# Patient Record
Sex: Female | Born: 1985 | Race: White | Hispanic: No | Marital: Married | Smoking: Never smoker
Health system: Southern US, Community
[De-identification: ages and names within clinical notes are randomized; demographics above are authoritative.]

## PROBLEM LIST (undated history)

## (undated) DIAGNOSIS — N809 Endometriosis, unspecified: Secondary | ICD-10-CM

## (undated) DIAGNOSIS — F419 Anxiety disorder, unspecified: Secondary | ICD-10-CM

## (undated) DIAGNOSIS — Z87442 Personal history of urinary calculi: Secondary | ICD-10-CM

## (undated) DIAGNOSIS — F32A Depression, unspecified: Secondary | ICD-10-CM

## (undated) DIAGNOSIS — R112 Nausea with vomiting, unspecified: Secondary | ICD-10-CM

## (undated) DIAGNOSIS — T8859XA Other complications of anesthesia, initial encounter: Secondary | ICD-10-CM

## (undated) DIAGNOSIS — F329 Major depressive disorder, single episode, unspecified: Secondary | ICD-10-CM

## (undated) DIAGNOSIS — M1990 Unspecified osteoarthritis, unspecified site: Secondary | ICD-10-CM

## (undated) DIAGNOSIS — Z9889 Other specified postprocedural states: Secondary | ICD-10-CM

## (undated) HISTORY — DX: Anxiety disorder, unspecified: F41.9

## (undated) HISTORY — DX: Endometriosis, unspecified: N80.9

## (undated) HISTORY — DX: Depression, unspecified: F32.A

## (undated) HISTORY — DX: Major depressive disorder, single episode, unspecified: F32.9

## (undated) HISTORY — PX: GASTRIC BYPASS: SHX52

---
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
## 2007-06-07 ENCOUNTER — Emergency Department: Payer: Self-pay | Admitting: Emergency Medicine

## 2007-09-16 ENCOUNTER — Emergency Department: Payer: Self-pay | Admitting: Unknown Physician Specialty

## 2007-10-12 ENCOUNTER — Ambulatory Visit: Payer: Self-pay | Admitting: Obstetrics and Gynecology

## 2007-10-27 ENCOUNTER — Emergency Department: Payer: Self-pay | Admitting: Emergency Medicine

## 2007-12-24 ENCOUNTER — Observation Stay: Payer: Self-pay | Admitting: Obstetrics and Gynecology

## 2008-01-27 ENCOUNTER — Observation Stay: Payer: Self-pay | Admitting: Obstetrics and Gynecology

## 2008-02-21 ENCOUNTER — Ambulatory Visit: Payer: Self-pay | Admitting: Obstetrics and Gynecology

## 2008-02-24 ENCOUNTER — Observation Stay: Payer: Self-pay | Admitting: Obstetrics and Gynecology

## 2008-03-11 ENCOUNTER — Inpatient Hospital Stay: Payer: Self-pay | Admitting: Obstetrics and Gynecology

## 2008-05-22 ENCOUNTER — Emergency Department: Payer: Self-pay | Admitting: Emergency Medicine

## 2008-06-15 ENCOUNTER — Emergency Department: Payer: Self-pay | Admitting: Emergency Medicine

## 2008-10-09 ENCOUNTER — Emergency Department: Payer: Self-pay | Admitting: Emergency Medicine

## 2008-10-13 ENCOUNTER — Ambulatory Visit: Payer: Self-pay | Admitting: Emergency Medicine

## 2009-03-02 ENCOUNTER — Encounter: Payer: Self-pay | Admitting: Obstetrics and Gynecology

## 2009-03-10 ENCOUNTER — Encounter: Payer: Self-pay | Admitting: Pediatric Cardiology

## 2009-03-23 ENCOUNTER — Encounter: Payer: Self-pay | Admitting: Obstetrics and Gynecology

## 2009-03-31 ENCOUNTER — Encounter: Payer: Self-pay | Admitting: Family Medicine

## 2009-04-05 ENCOUNTER — Encounter: Payer: Self-pay | Admitting: Family Medicine

## 2009-04-06 ENCOUNTER — Encounter: Payer: Self-pay | Admitting: Pediatric Cardiology

## 2009-04-20 ENCOUNTER — Observation Stay: Payer: Self-pay | Admitting: Obstetrics and Gynecology

## 2009-04-21 ENCOUNTER — Encounter: Payer: Self-pay | Admitting: Pediatric Cardiology

## 2009-05-14 ENCOUNTER — Encounter: Payer: Self-pay | Admitting: Pediatric Cardiology

## 2009-06-06 ENCOUNTER — Observation Stay: Payer: Self-pay | Admitting: Obstetrics and Gynecology

## 2009-06-13 ENCOUNTER — Observation Stay: Payer: Self-pay

## 2009-06-19 ENCOUNTER — Observation Stay: Payer: Self-pay | Admitting: Obstetrics & Gynecology

## 2009-06-22 ENCOUNTER — Observation Stay: Payer: Self-pay

## 2009-06-23 ENCOUNTER — Observation Stay: Payer: Self-pay

## 2009-06-25 ENCOUNTER — Inpatient Hospital Stay: Payer: Self-pay

## 2009-06-25 HISTORY — PX: TUBAL LIGATION: SHX77

## 2010-04-11 ENCOUNTER — Emergency Department: Payer: Self-pay | Admitting: Emergency Medicine

## 2010-09-05 HISTORY — PX: FRACTURE SURGERY: SHX138

## 2010-11-02 ENCOUNTER — Emergency Department: Payer: Self-pay | Admitting: Emergency Medicine

## 2011-03-29 ENCOUNTER — Emergency Department: Payer: Self-pay | Admitting: Unknown Physician Specialty

## 2011-04-12 ENCOUNTER — Emergency Department: Payer: Self-pay | Admitting: Emergency Medicine

## 2011-04-28 ENCOUNTER — Ambulatory Visit: Payer: Self-pay | Admitting: Specialist

## 2011-10-11 ENCOUNTER — Emergency Department: Payer: Self-pay | Admitting: Emergency Medicine

## 2011-10-11 LAB — BASIC METABOLIC PANEL
Anion Gap: 12 (ref 7–16)
Co2: 28 mmol/L (ref 21–32)
Creatinine: 0.83 mg/dL (ref 0.60–1.30)
EGFR (African American): >60
EGFR (Non-African Amer.): >60
Glucose: 97 mg/dL (ref 65–99)
Sodium: 144 mmol/L (ref 136–145)

## 2011-10-11 LAB — CBC
MCH: 29.5 pg (ref 26.0–34.0)
MCV: 89 fL (ref 80–100)
Platelet: 277 10*3/uL (ref 150–440)
RDW: 13.7 % (ref 11.5–14.5)
WBC: 9 10*3/uL (ref 3.6–11.0)

## 2011-10-11 LAB — URINALYSIS, COMPLETE
Bilirubin,UR: NEGATIVE
Ketone: NEGATIVE
Ph: 5 (ref 4.5–8.0)
RBC,UR: 1 /HPF (ref 0–5)
Specific Gravity: 1.027 (ref 1.003–1.030)
Squamous Epithelial: 3
WBC UR: 1 /HPF (ref 0–5)

## 2012-10-12 ENCOUNTER — Ambulatory Visit (INDEPENDENT_AMBULATORY_CARE_PROVIDER_SITE_OTHER): Payer: 59 | Admitting: Family Medicine

## 2012-10-12 ENCOUNTER — Encounter: Payer: Self-pay | Admitting: Family Medicine

## 2012-10-12 VITALS — BP 108/74 | HR 68 | Temp 98.4°F | Ht 70.0 in | Wt 291.0 lb

## 2012-10-12 DIAGNOSIS — F411 Generalized anxiety disorder: Secondary | ICD-10-CM

## 2012-10-12 DIAGNOSIS — E669 Obesity, unspecified: Secondary | ICD-10-CM

## 2012-10-12 DIAGNOSIS — R5381 Other malaise: Secondary | ICD-10-CM

## 2012-10-12 DIAGNOSIS — R5383 Other fatigue: Secondary | ICD-10-CM

## 2012-10-12 DIAGNOSIS — Z8349 Family history of other endocrine, nutritional and metabolic diseases: Secondary | ICD-10-CM

## 2012-10-12 DIAGNOSIS — F419 Anxiety disorder, unspecified: Secondary | ICD-10-CM | POA: Insufficient documentation

## 2012-10-12 LAB — COMPREHENSIVE METABOLIC PANEL
Albumin: 4.1 g/dL (ref 3.5–5.2)
Alkaline Phosphatase: 54 U/L (ref 39–117)
BUN: 13 mg/dL (ref 6–23)
CO2: 28 mEq/L (ref 19–32)
Glucose, Bld: 92 mg/dL (ref 70–99)
Potassium: 4.4 mEq/L (ref 3.5–5.3)

## 2012-10-12 LAB — CBC WITH DIFFERENTIAL/PLATELET
Eosinophils Absolute: 0.1 10*3/uL (ref 0.0–0.7)
Hemoglobin: 12.9 g/dL (ref 12.0–15.0)
Lymphocytes Relative: 19 % (ref 12–46)
Lymphs Abs: 1.3 10*3/uL (ref 0.7–4.0)
MCH: 28.4 pg (ref 26.0–34.0)
Monocytes Relative: 7 % (ref 3–12)
Neutro Abs: 5 10*3/uL (ref 1.7–7.7)
Neutrophils Relative %: 72 % (ref 43–77)
RBC: 4.54 MIL/uL (ref 3.87–5.11)
WBC: 6.8 10*3/uL (ref 4.0–10.5)

## 2012-10-12 LAB — T4, FREE: Free T4: 1.04 ng/dL (ref 0.80–1.80)

## 2012-10-12 MED ORDER — SERTRALINE HCL 50 MG PO TABS: 50.0000 mg | ORAL_TABLET | Freq: Every day | ORAL | Status: DC

## 2012-10-12 NOTE — Progress Notes (Signed)
Subjective:    Patient ID: Lauren Macias, female    DOB: [birth year], 27 y.o.   MRN: [id number]  HPI  Very pleasant 27 yo G4P3 here to establish care.  Anxiety- has had depression and anxiety since she was 27 years old, on a off.  As a teenager, she was placed on Lithium and then had subsequent suicide attempt. Both she and her physician at the time though suicide attempt was triggered by adverse rxn to Lithium.   Since her teenage years, she has only needed to be on an antidepressant once- post partum with one of her sons.  She was on Zoloft and she felt it worked well.  Two months ago, went to her physician for worsening anxiety- she is constantly anxious that something will happen to one of her children.  He started her on Celexa 20 mg.  She states she actually feels worse. She has been trying to lose weight and feels this is hindering that.  She also has decreased sex drive.  Denies any symptoms of depression. No SI or HI.  Obesity- previously weighed 338 lbs.  She is walking 30 minutes per day on a treadmill and motivated to lose weight. Wt Readings from Last 3 Encounters:  10/12/12 291 lb (131.997 kg)   Has a strong FH of hypothyroidism and wants to be tested today. Denies any other symptoms of hypothyroidism currently other than difficulty losing weight and fatigue.  Patient Active Problem List  Diagnosis  . Anxiety  . Obesity   Past Medical History  Diagnosis Date  . Anxiety   . Endometriosis   . Depression     h/o suicide attempt as teenager, she feels it was caused by lithium   No past surgical history on file. History  Substance Use Topics  . Smoking status: Never Smoker   . Smokeless tobacco: Not on file  . Alcohol Use: Not on file   Family History  Problem Relation Age of Onset  . Hypothyroidism Mother   . Hypothyroidism Maternal Aunt   . Cancer Paternal Aunt 58    breast CA   Allergies  Allergen Reactions  . Penicillins Anaphylaxis   Current  Outpatient Prescriptions on File Prior to Visit  Medication Sig Dispense Refill  . sertraline (ZOLOFT) 50 MG tablet Take 1 tablet (50 mg total) by mouth daily.  30 tablet  3   The PMH, PSH, Social History, Family History, Medications, and allergies have been reviewed in [facility], and have been updated if relevant.    Review of Systems See HPI     Objective:   Physical Exam BP 108/74  Pulse 68  Temp 98.4 F (36.9 C)  Ht 5\' 10"  (1.778 m)  Wt 291 lb (131.997 kg)  BMI 41.75 kg/m2  General:  obese,well-nourished,in no acute distress; alert,appropriate and cooperative throughout examination Head:  normocephalic and atraumatic.   Eyes:  vision grossly intact, pupils equal, pupils round, and pupils reactive to light.   Ears:  R ear normal and L ear normal.   Nose:  no external deformity.   Mouth:  good dentition.   Neck:  +enlarged thyroid, non tender, no masses noted. Lungs:  Normal respiratory effort, chest expands symmetrically. Lungs are clear to auscultation, no crackles or wheezes. Heart:  Normal rate and regular rhythm. S1 and S2 normal without gallop, murmur, click, rub or other extra sounds. Abdomen:  Bowel sounds positive,abdomen soft and non-tender without masses, organomegaly or hernias noted. Msk:  No deformity or scoliosis noted  of thoracic or lumbar spine.   Extremities:  No clubbing, cyanosis, edema, or deformity noted with normal full range of motion of all joints.   Neurologic:  alert & oriented X3 and gait normal.   Skin:  Intact without suspicious lesions or rashes Psych:  Cognition and judgment appear intact. Alert and cooperative with normal attention span and concentration. No apparent delusions, illusions, hallucinations        Assessment & Plan:   1. Fatigue  Likely due to very busy lifestyle but will check labs to rule out other possible contributing factors. Comprehensive metabolic panel, CBC with Differential, T4, Free  2. Family history of thyroid disease   See above. TSH, T4, Free  3. Obesity  Congratulated her on her continued weight loss and positive life style changes.   4. Anxiety  Deteriorated.  Will wean off celexa and start Zoloft 50 mg daily as this has worked well for her in past.  Follow up in 3 weeks. The patient indicates understanding of these issues and agrees with the plan.

## 2012-10-12 NOTE — Patient Instructions (Addendum)
Great to see you. Let's wean off of the citalopram- 1 tablet every other day for 1 week and stop. Ok to go ahead and start the Zoloft. Let me know how you're doing in 3 weeks.  Please schedule a complete physical/pap smear in October.  Have a good weekend with [place] and the boys.  :)

## 2012-10-24 ENCOUNTER — Ambulatory Visit: Payer: 59 | Admitting: Family Medicine

## 2012-10-25 ENCOUNTER — Emergency Department: Payer: Self-pay | Admitting: Emergency Medicine

## 2012-10-25 LAB — CBC
MCH: 29.1 pg (ref 26.0–34.0)
MCHC: 33 g/dL (ref 32.0–36.0)
MCV: 88 fL (ref 80–100)
RBC: 4.6 10*6/uL (ref 3.80–5.20)
RDW: 14.2 % (ref 11.5–14.5)
WBC: 6.7 10*3/uL (ref 3.6–11.0)

## 2012-10-25 LAB — COMPREHENSIVE METABOLIC PANEL
Albumin: 3.7 g/dL (ref 3.4–5.0)
Alkaline Phosphatase: 73 U/L (ref 50–136)
BUN: 14 mg/dL (ref 7–18)
Bilirubin,Total: 0.5 mg/dL (ref 0.2–1.0)
Calcium, Total: 8.7 mg/dL (ref 8.5–10.1)
EGFR (Non-African Amer.): >60
Glucose: 82 mg/dL (ref 65–99)
Osmolality: 277 (ref 275–301)
Potassium: 4.5 mmol/L (ref 3.5–5.1)
Sodium: 139 mmol/L (ref 136–145)

## 2012-10-25 LAB — URINALYSIS, COMPLETE
Bilirubin,UR: NEGATIVE
Nitrite: NEGATIVE
Protein: NEGATIVE
RBC,UR: 2 /HPF (ref 0–5)
Specific Gravity: 1.021 (ref 1.003–1.030)
WBC UR: 7 /HPF (ref 0–5)

## 2012-10-29 ENCOUNTER — Encounter: Payer: Self-pay | Admitting: Family Medicine

## 2012-10-29 ENCOUNTER — Ambulatory Visit (INDEPENDENT_AMBULATORY_CARE_PROVIDER_SITE_OTHER): Payer: 59 | Admitting: Family Medicine

## 2012-10-29 VITALS — BP 120/70 | HR 72 | Temp 97.7°F | Wt 300.0 lb

## 2012-10-29 DIAGNOSIS — F419 Anxiety disorder, unspecified: Secondary | ICD-10-CM

## 2012-10-29 DIAGNOSIS — E669 Obesity, unspecified: Secondary | ICD-10-CM

## 2012-10-29 DIAGNOSIS — F411 Generalized anxiety disorder: Secondary | ICD-10-CM

## 2012-10-29 MED ORDER — PHENTERMINE HCL 15 MG PO CAPS: ORAL_CAPSULE | ORAL | Status: DC

## 2012-10-29 NOTE — Progress Notes (Signed)
  Subjective:    Patient ID: Lauren Macias, female    DOB: 07/17/1986, 27 y.o.   MRN: [id number]  HPI  Very pleasant 27 yo G4P3 here for follow up.  Anxiety- has had depression and anxiety since she was 27 years old, on a off.  As a teenager, she was placed on Lithium and then had subsequent suicide attempt. Both she and her physician at the time though suicide attempt was triggered by adverse rxn to Lithium.   Since her teenage years, she has only needed to be on an antidepressant once- post partum with one of her sons. Weaned off Celexa and started Zoloft two weeks ago.  She feels much better.    Denies any symptoms of depression. No SI or HI.  Obesity- previously weighed 338 lbs.  She is walking 30 minutes per day on a treadmill and motivated to lose weight. Wt Readings from Last 3 Encounters:  10/29/12 300 lb (136.079 kg)  10/12/12 291 lb (131.997 kg)    Patient Active Problem List  Diagnosis  . Anxiety  . Obesity   Past Medical History  Diagnosis Date  . Anxiety   . Endometriosis   . Depression     h/o suicide attempt as teenager, she feels it was caused by lithium   No past surgical history on file. History  Substance Use Topics  . Smoking status: Never Smoker   . Smokeless tobacco: Not on file  . Alcohol Use: Not on file   Family History  Problem Relation Age of Onset  . Hypothyroidism Mother   . Hypothyroidism Maternal Aunt   . Cancer Paternal Aunt 78    breast CA   Allergies  Allergen Reactions  . Penicillins Anaphylaxis   Current Outpatient Prescriptions on File Prior to Visit  Medication Sig Dispense Refill  . Multiple Vitamin (MULTIVITAMIN) tablet Take 1 tablet by mouth daily.      . sertraline (ZOLOFT) 50 MG tablet Take 1 tablet (50 mg total) by mouth daily.  30 tablet  3   No current facility-administered medications on file prior to visit.   The PMH, PSH, Social History, Family History, Medications, and allergies have been reviewed in [facility], and  have been updated if relevant.    Review of Systems See HPI     Objective:   Physical Exam BP 120/70  Pulse 72  Temp(Src) 97.7 F (36.5 C)  Wt 300 lb (136.079 kg)  BMI 43.05 kg/m2  General:  obese,well-nourished,in no acute distress; alert,appropriate and cooperative throughout examination Head:  normocephalic and atraumatic.   Neurologic:  alert & oriented X3 and gait normal.   Skin:  Intact without suspicious lesions or rashes Psych:  Cognition and judgment appear intact. Alert and cooperative with normal attention span and concentration. No apparent delusions, illusions, hallucinations     Assessment & Plan:   1. Obesity Deteriorated.  She is motivated to lose weight.  She admits to snacking in afternoon.  Discussed risks and benefits of phentermine. Will start 15 mg twice daily.  Follow up in 1 month. The patient indicates understanding of these issues and agrees with the plan.    2. Anxiety Improved on Zoloft.  Continue current dose.

## 2012-10-29 NOTE — Patient Instructions (Addendum)
Good to see you. We are starting phentermine.  Please come see me in one month or have your blood pressure and pulse checked at [facility] and call me.

## 2012-11-26 ENCOUNTER — Ambulatory Visit: Payer: 59 | Admitting: Family Medicine

## 2012-11-28 ENCOUNTER — Telehealth: Payer: Self-pay

## 2012-11-28 MED ORDER — PHENTERMINE HCL 37.5 MG PO CAPS: 37.5000 mg | ORAL_CAPSULE | ORAL | Status: DC

## 2012-11-28 NOTE — Telephone Encounter (Signed)
Medicine called to [facility], advised patient. 

## 2012-11-28 NOTE — Telephone Encounter (Signed)
Pt left v/m she was to call in BP once a month and today BP is 124/70 and P 72  ; pt said Dr Dayton Martes would refill phentermine and possibly change dosage. [facility] [address]Please advise.Pt request call back.

## 2012-11-28 NOTE — Telephone Encounter (Signed)
Please call in rx as entered below. 

## 2013-01-05 ENCOUNTER — Emergency Department: Payer: Self-pay | Admitting: Emergency Medicine

## 2013-01-05 LAB — URINALYSIS, COMPLETE
Bacteria: NONE SEEN
Ketone: NEGATIVE
Protein: NEGATIVE
Specific Gravity: 1.02 (ref 1.003–1.030)

## 2013-01-05 LAB — BASIC METABOLIC PANEL
Anion Gap: 6 — ABNORMAL LOW (ref 7–16)
BUN: 10 mg/dL (ref 7–18)
Calcium, Total: 9 mg/dL (ref 8.5–10.1)
Chloride: 109 mmol/L — ABNORMAL HIGH (ref 98–107)
Co2: 25 mmol/L (ref 21–32)
Creatinine: 0.77 mg/dL (ref 0.60–1.30)
Glucose: 88 mg/dL (ref 65–99)
Potassium: 3.9 mmol/L (ref 3.5–5.1)

## 2013-01-06 LAB — ACETAMINOPHEN LEVEL
Acetaminophen: 10 ug/mL
Acetaminophen: 6 ug/mL — ABNORMAL LOW

## 2013-01-06 LAB — URINALYSIS, COMPLETE
Nitrite: NEGATIVE
Ph: 5 (ref 4.5–8.0)
Protein: 100
RBC,UR: 3390 /HPF (ref 0–5)
Specific Gravity: 1.033 (ref 1.003–1.030)

## 2013-01-06 LAB — DRUG SCREEN, URINE
Amphetamines, Ur Screen: POSITIVE (ref ?–1000)
Barbiturates, Ur Screen: NEGATIVE (ref ?–200)
Benzodiazepine, Ur Scrn: NEGATIVE (ref ?–200)
Cannabinoid 50 Ng, Ur ~~LOC~~: NEGATIVE (ref ?–50)
Cocaine Metabolite,Ur ~~LOC~~: NEGATIVE (ref ?–300)
Methadone, Ur Screen: NEGATIVE (ref ?–300)
Tricyclic, Ur Screen: NEGATIVE (ref ?–1000)

## 2013-01-06 LAB — COMPREHENSIVE METABOLIC PANEL
Alkaline Phosphatase: 70 U/L (ref 50–136)
Bilirubin,Total: 0.8 mg/dL (ref 0.2–1.0)
Chloride: 108 mmol/L — ABNORMAL HIGH (ref 98–107)
Creatinine: 0.83 mg/dL (ref 0.60–1.30)
Glucose: 88 mg/dL (ref 65–99)
Potassium: 3.4 mmol/L — ABNORMAL LOW (ref 3.5–5.1)
SGPT (ALT): 26 U/L (ref 12–78)
Sodium: 141 mmol/L (ref 136–145)
Total Protein: 7.2 g/dL (ref 6.4–8.2)

## 2013-01-06 LAB — ETHANOL
Ethanol %: <0.003 % (ref 0.000–0.080)
Ethanol: <3 mg/dL

## 2013-01-06 LAB — CBC
HCT: 40.6 % (ref 35.0–47.0)
MCHC: 34.3 g/dL (ref 32.0–36.0)
MCV: 89 fL (ref 80–100)
Platelet: 220 10*3/uL (ref 150–440)
RBC: 4.59 10*6/uL (ref 3.80–5.20)
WBC: 7.8 10*3/uL (ref 3.6–11.0)

## 2013-01-06 LAB — SALICYLATE LEVEL: Salicylates, Serum: <1.7 mg/dL

## 2013-01-07 ENCOUNTER — Inpatient Hospital Stay: Payer: Self-pay | Admitting: Psychiatry

## 2013-01-11 ENCOUNTER — Encounter: Payer: Self-pay | Admitting: Family Medicine

## 2013-01-11 ENCOUNTER — Ambulatory Visit (INDEPENDENT_AMBULATORY_CARE_PROVIDER_SITE_OTHER): Payer: 59 | Admitting: Family Medicine

## 2013-01-11 VITALS — BP 120/72 | HR 84 | Temp 97.7°F | Ht 70.0 in | Wt 273.5 lb

## 2013-01-11 DIAGNOSIS — N63 Unspecified lump in unspecified breast: Secondary | ICD-10-CM

## 2013-01-11 DIAGNOSIS — F32A Depression, unspecified: Secondary | ICD-10-CM | POA: Insufficient documentation

## 2013-01-11 DIAGNOSIS — N6310 Unspecified lump in the right breast, unspecified quadrant: Secondary | ICD-10-CM | POA: Insufficient documentation

## 2013-01-11 DIAGNOSIS — F3289 Other specified depressive episodes: Secondary | ICD-10-CM

## 2013-01-11 DIAGNOSIS — F329 Major depressive disorder, single episode, unspecified: Secondary | ICD-10-CM

## 2013-01-11 DIAGNOSIS — R6889 Other general symptoms and signs: Secondary | ICD-10-CM

## 2013-01-11 DIAGNOSIS — R7989 Other specified abnormal findings of blood chemistry: Secondary | ICD-10-CM | POA: Insufficient documentation

## 2013-01-11 MED ORDER — SERTRALINE HCL 100 MG PO TABS: 100.0000 mg | ORAL_TABLET | Freq: Every day | ORAL | Status: DC

## 2013-01-11 NOTE — Patient Instructions (Addendum)
Good to see you, Lauren Macias. Happy mother's day. Hang in there.  Please stop by to see Lauren Macias on your way out to set up your referral.  We will call you with your lab work.

## 2013-01-11 NOTE — Progress Notes (Signed)
Subjective:    Patient ID: Lauren Macias, female    DOB: 06/28/1986, 27 y.o.   MRN: [id number]  HPI  Very pleasant 27 yo female here to discuss breast mass and a few other issues.  1.  Right breast mass- felt it a few weeks ago but getting larger and now painful.  Has strong family h/o breast CA- maternal and paternal Aunts at young ages (37s and 68s). No fevers, night sweats or changes in her nipple.  No nipple discharge.  2.  Depression- admitted to [facility] over the weekend for suicide attempt- drank two bottles of Robitussin.  Admits to not taking her Zoloft for weeks prior and she got in a fight with her boyfriend.  Her Zoloft was increased to 100 mg daily. She feels much better now.  No SI and HI.  States she would never try this again because she loves her kids too much.  3.  Per pt, was told her TSH was abnormal in hospital. Lab Results  Component Value Date   TSH 0.625 10/12/2012   Patient Active Problem List   Diagnosis Date Noted  . Abnormal TSH 01/11/2013  . Breast mass, right 01/11/2013  . Depression 01/11/2013  . Obesity 10/12/2012  . Anxiety    Past Medical History  Diagnosis Date  . Anxiety   . Endometriosis   . Depression     h/o suicide attempt as teenager, she feels it was caused by lithium   No past surgical history on file. History  Substance Use Topics  . Smoking status: Never Smoker   . Smokeless tobacco: Not on file  . Alcohol Use: Not on file   Family History  Problem Relation Age of Onset  . Hypothyroidism Mother   . Hypothyroidism Maternal Aunt   . Cancer Paternal Aunt 59    breast CA   Allergies  Allergen Reactions  . Penicillins Anaphylaxis   Current Outpatient Prescriptions on File Prior to Visit  Medication Sig Dispense Refill  . Multiple Vitamin (MULTIVITAMIN) tablet Take 1 tablet by mouth daily.      . phentermine 37.5 MG capsule Take 1 capsule (37.5 mg total) by mouth every morning.  30 capsule  0   No current  facility-administered medications on file prior to visit.   The PMH, PSH, Social History, Family History, Medications, and allergies have been reviewed in [facility], and have been updated if relevant.    Review of Systems See HPI    Objective:   Physical Exam  Constitutional: She appears well-developed and well-nourished. No distress.  Pulmonary/Chest: Right breast exhibits mass and tenderness. Right breast exhibits no inverted nipple, no nipple discharge and no skin change. Left breast exhibits no inverted nipple, no mass, no nipple discharge, no skin change and no tenderness.    Psychiatric: She has a normal mood and affect. Her speech is normal and behavior is normal. Judgment normal. Thought content is paranoid. Cognition and memory are normal. She expresses no suicidal plans and no homicidal plans.  . BP 120/72  Pulse 84  Temp(Src) 97.7 F (36.5 C) (Oral)  Ht 5\' 10"  (1.778 m)  Wt 273 lb 8 oz (124.059 kg)  BMI 39.24 kg/m2  SpO2 97%        Assessment & Plan:  1. Abnormal TSH Recheck labs here today. - TSH - T4, Free  2. Breast mass, right Will get [place] for further evaluation. Most likely a cyst or lymph node. - US Breast Right; Future  3. Depression No  longer suicidal.  She feels better on higher dose of zoloft- rx refilled. She will continue follow up with psychiatry. The patient indicates understanding of these issues and agrees with the plan.

## 2013-01-16 ENCOUNTER — Other Ambulatory Visit: Payer: Self-pay | Admitting: Family Medicine

## 2013-01-16 ENCOUNTER — Ambulatory Visit: Payer: Self-pay | Admitting: Family Medicine

## 2013-01-16 ENCOUNTER — Encounter: Payer: Self-pay | Admitting: Family Medicine

## 2013-01-16 DIAGNOSIS — N6310 Unspecified lump in the right breast, unspecified quadrant: Secondary | ICD-10-CM

## 2013-01-22 ENCOUNTER — Encounter: Payer: Self-pay | Admitting: General Surgery

## 2013-01-22 ENCOUNTER — Ambulatory Visit (INDEPENDENT_AMBULATORY_CARE_PROVIDER_SITE_OTHER): Payer: Commercial Managed Care - PPO | Admitting: General Surgery

## 2013-01-22 VITALS — BP 112/78 | Ht 71.0 in | Wt 279.0 lb

## 2013-01-22 DIAGNOSIS — N6089 Other benign mammary dysplasias of unspecified breast: Secondary | ICD-10-CM

## 2013-01-22 DIAGNOSIS — N6081 Other benign mammary dysplasias of right breast: Secondary | ICD-10-CM

## 2013-01-22 NOTE — Patient Instructions (Addendum)
Continue self breast exams. Call office for any new breast issues or concerns. Call for redness, swelling or pain in the right breast near the skin cyst.

## 2013-01-22 NOTE — Progress Notes (Signed)
Patient ID: Lauren Macias, female   DOB: 02/11/1986, 27 y.o.   MRN: [id number]  Chief Complaint  Patient presents with  . Breast Problem    HPI Lauren Macias is a 27 y.o. female.  Patient here today for breast evaluation.  States she found a right breast mass about 1 month ago.  It has not changed in size and there is no pain.  Denies any breast trauma.  Ultrasound was done at [facility]. Family history of breast cancer includes 5 paternal aunts and maternal grandmother(breast and ovarian) and maternal aunt(breast and ovarian). HPI  Past Medical History  Diagnosis Date  . Anxiety   . Endometriosis   . Depression     h/o suicide attempt as teenager, she feels it was caused by lithium    Past Surgical History  Procedure Laterality Date  . Dental surgery  1996  . Fracture surgery  2012    broken finger  . Cyst removal hand Left 2007    left finger    Family History  Problem Relation Age of Onset  . Hypothyroidism Mother   . Hypothyroidism Maternal Aunt   . Cancer Paternal Aunt 53    breast CA    Social History History  Substance Use Topics  . Smoking status: Never Smoker   . Smokeless tobacco: Not on file  . Alcohol Use: Yes     Comment: occassional    Allergies  Allergen Reactions  . Penicillins Anaphylaxis    Current Outpatient Prescriptions  Medication Sig Dispense Refill  . Multiple Vitamin (MULTIVITAMIN) tablet Take 1 tablet by mouth daily.      . sertraline (ZOLOFT) 100 MG tablet Take 1 tablet (100 mg total) by mouth daily.  30 tablet  3   No current facility-administered medications for this visit.    Review of Systems Review of Systems  Constitutional: Negative.   Respiratory: Negative.   Cardiovascular: Negative.     Blood pressure 112/78, height 5\' 11"  (1.803 m), weight 279 lb (126.554 kg), last menstrual period 01/03/2013.  Physical Exam Physical Exam  Constitutional: She is oriented to person, place, and time. She appears well-developed and  well-nourished.  Pulmonary/Chest: Right breast exhibits no inverted nipple, no mass, no nipple discharge, no skin change and no tenderness. Left breast exhibits no inverted nipple, no mass, no nipple discharge, no skin change and no tenderness.  Lymphadenopathy:    She has no cervical adenopathy.    She has no axillary adenopathy.  Neurological: She is alert and oriented to person, place, and time.  Skin: Skin is warm and dry.   Skin cyst 1 cm anchored to skin, right breast 3 o'clock  medial edge   Data Reviewed Ultrasound from [facility] shows findings hypoechoic mass attached to skin, clinical and ultrasound finding are consistent with skin cyst  Assessment    Benign skin cyst right breast area    Plan    No need for intervention of any type       SANKAR,SEEPLAPUTHUR G 01/23/2013, 12:54 PM

## 2013-01-23 ENCOUNTER — Encounter: Payer: Self-pay | Admitting: General Surgery

## 2013-01-23 DIAGNOSIS — N6089 Other benign mammary dysplasias of unspecified breast: Secondary | ICD-10-CM | POA: Insufficient documentation

## 2013-02-19 ENCOUNTER — Telehealth: Payer: Self-pay

## 2013-02-19 NOTE — Telephone Encounter (Signed)
Pt called for refill on zoloft; spoke with Delice Bison at [facility] pt has refills available there; pt will call [facility] for refills.

## 2013-04-13 ENCOUNTER — Emergency Department: Payer: Self-pay | Admitting: Emergency Medicine

## 2013-06-04 ENCOUNTER — Emergency Department: Payer: Self-pay | Admitting: Internal Medicine

## 2013-06-05 ENCOUNTER — Encounter: Payer: 59 | Admitting: Family Medicine

## 2013-06-05 DIAGNOSIS — Z0289 Encounter for other administrative examinations: Secondary | ICD-10-CM

## 2014-04-15 ENCOUNTER — Ambulatory Visit: Payer: Self-pay | Admitting: Physician Assistant

## 2014-07-07 ENCOUNTER — Encounter: Payer: Self-pay | Admitting: General Surgery

## 2014-10-04 ENCOUNTER — Emergency Department: Payer: Self-pay | Admitting: Physician Assistant

## 2014-12-26 NOTE — H&P (Signed)
PATIENT NAME:  Lauren Macias, Lauren Macias MR#:  [id number] DATE OF BIRTH:  08-24-1986  DATE OF ADMISSION:  01/07/2013  I saw her on May 6.  IDENTIFYING INFORMATION AND CHIEF COMPLAINT:  A 29 year old woman brought to the Emergency Room by EMS after attempting suicide.   CHIEF COMPLAINT:  "I was just going through a bad time."   HISTORY OF PRESENT ILLNESS:  The patient reports that she was feeling particularly depressed and down recently.  She has had long-standing depression, but in the last two weeks she discontinued her antidepressant medicine.  She got angry arguing with her boyfriend and impulsively decided to commit suicide.  She drank 2 to 3 bottles of Nyquil or similar cold medicine.  The next thing she remembers she was at the hospital.  Apparently her boyfriend found her and had her brought to the hospital.  The patient states that she had not been having frequent suicidal ideation, but had been aware that her mood was worse.  She was feeling more down with less energy, more anxiety was present.  She was crying more, having more trouble working.  Does not report any psychotic symptoms.  She denies that she was abusing alcohol or drugs.  The patient is under chronic stress as she is a single mother who also works and attends school.   PAST PSYCHIATRIC HISTORY:  No previous hospitalizations.  She has had a previous overdose attempt many years ago without hospitalization.  Used to see a psychiatrist and therapist in the past, but more recently is just seeing her primary care doctor who had been prescribing her Zoloft 50 mg a day.  She thought that this was not very helpful until she discontinued it and found herself feeling worse.   SUBSTANCE ABUSE HISTORY:  Denies that she drinks frequently.  Denies other substance abuse.  No past history identified of substance abuse problems.   SOCIAL HISTORY:  The patient has three young children.  She is currently employed as a Psychologist, counselling and also attends  [employer].  Her relationship overall with her boyfriend she says is pretty good and she does not blame him for the situation that brought her to this point.   PAST MEDICAL HISTORY:  The patient is overweight.  Otherwise, no significant ongoing medical problems.   FAMILY HISTORY:  Positive for anxiety and depression.   REVIEW OF SYSTEMS:  As noted above, recent depressed mood, anxiety, increased fatigue, poor job performance, feeling more disorganized.  No psychotic symptoms.  No loosening of associations.  No delusions.  No hallucinations.   MENTAL STATUS EXAMINATION:  Casually dressed, reasonably well-groomed woman, looks her stated age.  Cooperative with the interview.  Good eye contact.  Psychomotor activity normal.  Speech was normal in rate, tone and volume.  Affect somewhat anxious, but reactive and appropriate.  Mood stated as being better.  Thoughts appear to be generally lucid.  No evidence of loosening of associations or delusions.  Denies auditory or visual hallucinations.  Denies suicidal or homicidal ideation.  Shows improved judgment and insight.  Normal intelligence.  Alert and oriented x 4.   MEDICATIONS:  On admission, no medicines.   ALLERGIES:  PENICILLIN.   PHYSICAL EXAMINATION: GENERAL:  Overweight woman in no acute distress.  SKIN:  No acute skin lesions identified.  HEENT:  Pupils equal and reactive.  Face symmetric.  BACK AND NECK:  Nontender.  MUSCULOSKELETAL:  Full range of motion at all extremities.  Normal gait.  Normal strength and  reflexes throughout.  LUNGS:  Clear with no wheezes.  HEART:  Regular rate and rhythm.  ABDOMEN:  Soft, nontender, normal bowel sounds.  VITAL SIGNS:  Show a temperature 97.6, pulse 76, blood pressure 104/79.   LABORATORY RESULTS:  Slightly elevated chloride at 109 and slightly low anion gap at 6.  Otherwise unremarkable chemistries.  CBC normal.  Pregnancy test negative.  Urinalysis did show quite a bit of blood,  possible borderline infection.   ASSESSMENT:  A 29 year old woman status post a suicide attempt.  It was impulsive.  History of major depression.  Currently shows improved insight.  Denies suicidal ideation.  Able to name multiple things in her life that she wants to live for starting with her children.  No sign of acute psychosis.  Agreeable to treatment.   TREATMENT PLAN:  The patient was admitted to psychiatry for stabilization after a suicide attempt.  Restart Zoloft.  Psychoeducational and supportive therapy done.  Engage her in groups and activities.  Discussed possible follow-up treatment, but probably find a therapist for her and possibly follow up with her primary care doctor for the medication.   DIAGNOSIS, PRINCIPAL AND PRIMARY:  AXIS I:  Major depression, severe, recurrent.   SECONDARY DIAGNOSES: AXIS I:  Deferred.  AXIS II:  Deferred.  AXIS III:  Status post overdose of cough syrup without sequela, overweight, urinary tract infection.  AXIS IV:  Moderate chronic stress from multiple demands on her time.  AXIS V:  Functioning at time of evaluation 29.      ____________________________ Gonzella Lex, MD jtc:ea D: 01/09/2013 00:23:49 ET T: 01/09/2013 01:08:25 ET JOB#: [id number]  cc: Gonzella Lex, MD, <Dictator> Gonzella Lex MD ELECTRONICALLY SIGNED 01/09/2013 19:44

## 2014-12-26 NOTE — Discharge Summary (Signed)
PATIENT NAME:  Lauren Macias, Lauren Macias MR#:  [id number] DATE OF BIRTH:  [birth year]  DATE OF ADMISSION:  01/07/2013 DATE OF DISCHARGE: 01/09/2013    HOSPITAL COURSE: See dictated history and physical for details of admission. This 29 year old woman came in through the Emergency Room having taken an overdose of cough syrup with suicidal intent. Initially, she was somewhat groggy, but quickly recovered. Did not need any physical detox. She gave a history of long-standing depression and increased recent stresses from multiple roles in her life. She has not shown any suicidal behavior in the hospital. She has appeared to be lucid, and she has cooperated appropriately with treatment. We have restarted her Zoloft and put the dose to 100 mg at this point. She has tolerated medicine well. She has attended groups and interacted appropriately. She shows good insight and states that she is motivated to take better care of her mood and has multiple positive things in her life to live for. She also has been treated for a urinary tract infection, and it is also noted that she has a persistently low TSH on her lab tests. She has been notified of these things, and it is recommended that she follow up with her primary care doctor. Overall, psychiatrically, we recommend medical followup with her primary care doctor and also followup with a psychotherapist, and she will be given referrals at discharge.   DISCHARGE MEDICATIONS: Zoloft 100 mg p.o. daily, Septra DS 1 tablet twice a day for 6 days.   LABORATORY RESULTS: Chemistry panel unremarkable on admission. Alcohol level undetected TSH low at 0.29. Drug screen positive for amphetamines. CBC normal. Followup chemistries continue to show a low TSH with a repeat level of 0.168.   MENTAL STATUS EXAM AT DISCHARGE: Neatly dressed and groomed woman, looks her stated age, cooperative with the interview. Good eye contact. Normal psychomotor activity. Speech normal rate, tone and volume.  Affect euthymic, reactive, appropriate. Mood stated as good. Thoughts lucid. No loosening of associations. Denies auditory or visual hallucinations. Denies suicidal or homicidal ideation. Shows good insight and judgment. Normal intelligence.   DIAGNOSIS, PRINCIPAL AND PRIMARY:  AXIS I: Major depression, recurrent, severe.   SECONDARY DIAGNOSES:  AXIS I: No further.  AXIS II: Borderline personality disorder traits.  AXIS III: Obesity, urinary tract infection, rule out hyperthyroidism.  AXIS IV: Moderate to severe chronic stress.  AXIS V: Functioning at time of discharge 62.     ____________________________ Gonzella Lex, MD jtc:OSi D: 01/09/2013 12:43:32 ET T: 01/09/2013 13:20:14 ET JOB#: [id number]  cc: Gonzella Lex, MD, <Dictator> Gonzella Lex MD ELECTRONICALLY SIGNED 01/09/2013 19:44

## 2015-11-29 ENCOUNTER — Emergency Department: Payer: Managed Care, Other (non HMO)

## 2015-11-29 ENCOUNTER — Encounter: Payer: Self-pay | Admitting: Emergency Medicine

## 2015-11-29 ENCOUNTER — Emergency Department
Admission: EM | Admit: 2015-11-29 | Discharge: 2015-11-29 | Disposition: A | Discharge status: Home / Self Care | Payer: Managed Care, Other (non HMO) | Attending: Student | Admitting: Student

## 2015-11-29 DIAGNOSIS — S62636A Displaced fracture of distal phalanx of right little finger, initial encounter for closed fracture: Secondary | ICD-10-CM | POA: Insufficient documentation

## 2015-11-29 DIAGNOSIS — Y92009 Unspecified place in unspecified non-institutional (private) residence as the place of occurrence of the external cause: Secondary | ICD-10-CM | POA: Insufficient documentation

## 2015-11-29 DIAGNOSIS — M79644 Pain in right finger(s): Secondary | ICD-10-CM | POA: Diagnosis present

## 2015-11-29 DIAGNOSIS — F329 Major depressive disorder, single episode, unspecified: Secondary | ICD-10-CM | POA: Diagnosis not present

## 2015-11-29 DIAGNOSIS — W2209XA Striking against other stationary object, initial encounter: Secondary | ICD-10-CM | POA: Diagnosis not present

## 2015-11-29 DIAGNOSIS — N63 Unspecified lump in breast: Secondary | ICD-10-CM | POA: Diagnosis not present

## 2015-11-29 DIAGNOSIS — Y9389 Activity, other specified: Secondary | ICD-10-CM | POA: Insufficient documentation

## 2015-11-29 DIAGNOSIS — Y999 Unspecified external cause status: Secondary | ICD-10-CM | POA: Diagnosis not present

## 2015-11-29 DIAGNOSIS — E669 Obesity, unspecified: Secondary | ICD-10-CM | POA: Diagnosis not present

## 2015-11-29 MED ORDER — HYDROCODONE-ACETAMINOPHEN 5-325 MG PO TABS: 1.0000 | ORAL_TABLET | ORAL | Status: DC | PRN

## 2015-11-29 MED ORDER — IBUPROFEN 800 MG PO TABS: 800.0000 mg | ORAL_TABLET | Freq: Three times a day (TID) | ORAL | Status: DC

## 2015-11-29 NOTE — ED Provider Notes (Signed)
[facility] Emergency Department Provider Note  ____________________________________________  Time seen: Approximately 7:27 AM  I have reviewed the triage vital signs and the nursing notes.   HISTORY  Chief Complaint Hand Pain   HPI Lauren Macias is a 30 y.o. female is here with complaint of right fifth finger pain since last night. Patient states she was trying on some close at a friend's house and hit her fifth finger against a low ceiling. She has had throbbing and swelling in her finger since then. Patient took ibuprofen yesterday and Tylenol without any relief.patient rates her pain as a 5/10.   Past Medical History  Diagnosis Date  . Anxiety   . Endometriosis   . Depression     h/o suicide attempt as teenager, she feels it was caused by lithium    Patient Active Problem List   Diagnosis Date Noted  . Cyst of skin of breast 01/23/2013  . Abnormal TSH 01/11/2013  . Breast mass, right 01/11/2013  . Depression 01/11/2013  . Obesity 10/12/2012  . Anxiety     Past Surgical History  Procedure Laterality Date  . Dental surgery  1996  . Fracture surgery  2012    broken finger  . Cyst removal hand Left 2007    left finger    Current Outpatient Rx  Name  Route  Sig  Dispense  Refill  . HYDROcodone-acetaminophen (NORCO/VICODIN) 5-325 MG tablet   Oral   Take 1 tablet by mouth every 4 (four) hours as needed for moderate pain.   20 tablet   0   . ibuprofen (ADVIL,MOTRIN) 800 MG tablet   Oral   Take 1 tablet (800 mg total) by mouth 3 (three) times daily.   30 tablet   0   . Multiple Vitamin (MULTIVITAMIN) tablet   Oral   Take 1 tablet by mouth daily.         . sertraline (ZOLOFT) 100 MG tablet   Oral   Take 1 tablet (100 mg total) by mouth daily.   30 tablet   3     Allergies Penicillins  Family History  Problem Relation Age of Onset  . Hypothyroidism Mother   . Hypothyroidism Maternal Aunt   . Cancer Paternal Aunt  68    breast CA    Social History Social History  Substance Use Topics  . Smoking status: Never Smoker   . Smokeless tobacco: None  . Alcohol Use: Yes     Comment: occassional    Review of Systems Constitutional: No fever/chills Eyes: No visual changes. ENT: No sore throat. Cardiovascular: Denies chest pain. Respiratory: Denies shortness of breath. Musculoskeletal: Positive right fifth finger pain. Skin: Negative for rash. Neurological: Negative for headaches, focal weakness or numbness.   10-point ROS otherwise negative.  ____________________________________________   PHYSICAL EXAM:  VITAL SIGNS: ED Triage Vitals  Enc Vitals Group     BP 11/29/15 0614 123/71 mmHg     Pulse Rate 11/29/15 0614 72     Resp 11/29/15 0614 18     Temp 11/29/15 0614 97.9 F (36.6 C)     Temp Source 11/29/15 0614 Oral     SpO2 11/29/15 0614 99 %     Weight 11/29/15 0614 270 lb (122.471 kg)     Height 11/29/15 0614 5\' 11"  (1.803 m)     Head Cir --      Peak Flow --      Pain Score 11/29/15 0615 5  Pain Loc --      Pain Edu? --      Excl. in [place]? --     Constitutional: Alert and oriented. Well appearing and in no acute distress. Eyes: Conjunctivae are normal. PERRL. EOMI. Head: Atraumatic. Nose: No congestion/rhinnorhea. Neck: No stridor.   Cardiovascular: Normal rate, regular rhythm. Grossly normal heart sounds.  Good peripheral circulation. Respiratory: Normal respiratory effort.  No retractions. Lungs CTAB. Musculoskeletal:Moderate tenderness on the DIP joint of the right fifth digit. There is moderate soft tissue swelling present. Capillary refill less than 3 seconds. Skin is intact. Range of motion is restricted secondary to discomfort. Patient is able to flex and extend minimally due to discomfort. Neurologic:  Normal speech and language. No gross focal neurologic deficits are appreciated. No gait instability. Skin:  Skin is warm, dry and intact. No rash noted. Psychiatric:  Mood and affect are normal. Speech and behavior are normal.  ____________________________________________   LABS (all labs ordered are listed, but only abnormal results are displayed)  Labs Reviewed - No data to display  RADIOLOGY  right fifth finger per radiologist shows acute nondisplaced fracture base of distal phalanx. I, Johnn Hai, personally viewed and evaluated these images (plain radiographs) as part of my medical decision making, as well as reviewing the written report by the radiologist. ____________________________________________   PROCEDURES  Procedure(s) performed: None  Critical Care performed: No  ____________________________________________   INITIAL IMPRESSION / ASSESSMENT AND PLAN / ED COURSE  Pertinent labs & imaging results that were available during my care of the patient were reviewed by me and considered in my medical decision making (see chart for details).  Fourth and fifth finger were buddy taped along with a metal finger splint. Patient is follow-up with Dr. Sabra Heck who is the orthopedist on call today. Patient was given a prescription for Norco as needed for severe pain and ibuprofen as needed for inflammation and pain. ____________________________________________   FINAL CLINICAL IMPRESSION(S) / ED DIAGNOSES  Final diagnoses:  Fracture of fifth finger, distal phalanx, right, closed, initial encounter      Johnn Hai, PA-C 11/29/15 [place] Gayle, MD 11/29/15 [phone]

## 2015-11-29 NOTE — Discharge Instructions (Signed)
Finger Fracture Finger fractures are breaks in the bones of the fingers. There are many types of fractures. There are also different ways of treating these fractures. Your doctor will talk with you about the best way to treat your fracture. Injury is the main cause of broken fingers. This includes:  Injuries while playing sports.  Workplace injuries.  Falls. HOME CARE  Follow your doctor's instructions for:  Activities.  Exercises.  Physical therapy.  Take medicines only as told by your doctor for pain, discomfort, or fever. GET HELP IF: You have pain or swelling that limits:  The motion of your fingers.  The use of your fingers. GET HELP RIGHT AWAY IF:  You cannot feel your fingers, or your fingers become numb.   This information is not intended to replace advice given to you by your health care provider. Make sure you discuss any questions you have with your health care provider.   Document Released: 02/08/2008 Document Revised: 09/12/2014 Document Reviewed: 04/03/2013 Elsevier Interactive Patient Education 2016 [place] and elevate to reduce swelling. Wear splint for added protection. Ibuprofen as needed for pain and inflammation. Is for severe pain as needed. Call Dr. Ammie Ferrier office on Monday for a follow-up appointment.

## 2015-11-29 NOTE — ED Notes (Signed)
Patient states she hit her right hand on a low ceiling at a friends house around 530-600pm.  Reports over night her 5th finger started throbbing and swelling.  She took ibuprofen yesterday evening and tylenol around 10pm.

## 2015-11-29 NOTE — ED Notes (Signed)
Patient states that she accidentally hit her hand on a low ceiling and now having pain and swelling to right fifth finger.

## 2017-06-11 ENCOUNTER — Emergency Department
Admission: EM | Admit: 2017-06-11 | Discharge: 2017-06-11 | Disposition: A | Discharge status: Home / Self Care | Payer: BLUE CROSS/BLUE SHIELD | Attending: Emergency Medicine | Admitting: Emergency Medicine

## 2017-06-11 ENCOUNTER — Encounter: Payer: Self-pay | Admitting: Emergency Medicine

## 2017-06-11 DIAGNOSIS — Y999 Unspecified external cause status: Secondary | ICD-10-CM | POA: Insufficient documentation

## 2017-06-11 DIAGNOSIS — Y929 Unspecified place or not applicable: Secondary | ICD-10-CM | POA: Diagnosis not present

## 2017-06-11 DIAGNOSIS — R51 Headache: Secondary | ICD-10-CM | POA: Insufficient documentation

## 2017-06-11 DIAGNOSIS — S50862A Insect bite (nonvenomous) of left forearm, initial encounter: Secondary | ICD-10-CM | POA: Insufficient documentation

## 2017-06-11 DIAGNOSIS — Z79899 Other long term (current) drug therapy: Secondary | ICD-10-CM | POA: Diagnosis not present

## 2017-06-11 DIAGNOSIS — W57XXXA Bitten or stung by nonvenomous insect and other nonvenomous arthropods, initial encounter: Secondary | ICD-10-CM | POA: Insufficient documentation

## 2017-06-11 DIAGNOSIS — Y939 Activity, unspecified: Secondary | ICD-10-CM | POA: Insufficient documentation

## 2017-06-11 MED ORDER — HYDROXYZINE HCL 50 MG PO TABS: 50.0000 mg | ORAL_TABLET | Freq: Three times a day (TID) | ORAL | 0 refills | Status: DC | PRN

## 2017-06-11 MED ORDER — ONDANSETRON 4 MG PO TBDP
ORAL_TABLET | ORAL | Status: AC
  Filled 2017-06-11: qty 1

## 2017-06-11 MED ORDER — TRAMADOL HCL 50 MG PO TABS
50.0000 mg | ORAL_TABLET | Freq: Once | ORAL | Status: AC
  Administered 2017-06-11: 50 mg via ORAL
  Filled 2017-06-11: qty 1

## 2017-06-11 MED ORDER — METHYLPREDNISOLONE 4 MG PO TBPK: ORAL_TABLET | ORAL | 0 refills | Status: DC

## 2017-06-11 MED ORDER — HYDROXYZINE HCL 50 MG PO TABS
50.0000 mg | ORAL_TABLET | Freq: Once | ORAL | Status: AC
  Administered 2017-06-11: 50 mg via ORAL
  Filled 2017-06-11: qty 1

## 2017-06-11 MED ORDER — ONDANSETRON 4 MG PO TBDP
4.0000 mg | ORAL_TABLET | Freq: Once | ORAL | Status: AC
  Administered 2017-06-11: 4 mg via ORAL

## 2017-06-11 MED ORDER — DEXAMETHASONE SODIUM PHOSPHATE 10 MG/ML IJ SOLN
10.0000 mg | Freq: Once | INTRAMUSCULAR | Status: AC
  Administered 2017-06-11: 10 mg via INTRAMUSCULAR
  Filled 2017-06-11: qty 1

## 2017-06-11 MED ORDER — ONDANSETRON 8 MG PO TBDP
8.0000 mg | ORAL_TABLET | Freq: Once | ORAL | Status: AC
  Administered 2017-06-11: 8 mg via ORAL
  Filled 2017-06-11: qty 1

## 2017-06-11 NOTE — ED Triage Notes (Addendum)
Pt reports got bit by a spider she thinks but is not sure.  Put arm down on something and felt sting.  Redness/swelling noted around area pt c/o pain. Started with headache as well after getting bit. Started today.  Describes as burning sensation.  Happened about 1 hr ago. Vomit X 1. Still nauseated per pt.

## 2017-06-11 NOTE — ED Notes (Signed)
[place] for flex per dr Archie Balboa

## 2017-06-11 NOTE — ED Notes (Signed)
PA in before RN. Area on left arm is red, circular and itchy. Patient denies fever at home.

## 2017-06-11 NOTE — ED Provider Notes (Signed)
[facility] Emergency Department Provider Note   ____________________________________________   First MD Initiated Contact with Patient 06/11/17 2038     (approximate)  I have reviewed the triage vital signs and the nursing notes.   HISTORY  Chief Complaint Insect Bite and Headache    HPI Lauren Macias is a 31 y.o. female patient presents with insect bite to the left forearm. Patient took it was a spider. Incident happened approximately prior to arrival. Patient states she noticed immediate redness and swelling at the pain site. Patient also suffered a headache after getting bit. Patient stated this been 1 episode of vomiting but continued nausea. Patient denies loss sensation or loss of function of the left upper extremity.Patient rates her pain discomfort as a 5/10. No palliative measures for complaint.   Past Medical History:  Diagnosis Date  . Anxiety   . Depression    h/o suicide attempt as teenager, she feels it was caused by lithium  . Endometriosis     Patient Active Problem List   Diagnosis Date Noted  . Cyst of skin of breast 01/23/2013  . Abnormal TSH 01/11/2013  . Breast mass, right 01/11/2013  . Depression 01/11/2013  . Obesity 10/12/2012  . Anxiety     Past Surgical History:  Procedure Laterality Date  . CYST REMOVAL HAND Left 2007   left finger  . DENTAL SURGERY  1996  . FRACTURE SURGERY  2012   broken finger    Prior to Admission medications   Medication Sig Start Date End Date Taking? Authorizing Provider  HYDROcodone-acetaminophen (NORCO/VICODIN) 5-325 MG tablet Take 1 tablet by mouth every 4 (four) hours as needed for moderate pain. 11/29/15   Johnn Hai, PA-C  hydrOXYzine (ATARAX/VISTARIL) 50 MG tablet Take 1 tablet (50 mg total) by mouth 3 (three) times daily as needed for itching. 06/11/17   Sable Feil, PA-C  ibuprofen (ADVIL,MOTRIN) 800 MG tablet Take 1 tablet (800 mg total) by mouth 3 (three)  times daily. 11/29/15   Johnn Hai, PA-C  methylPREDNISolone (MEDROL DOSEPAK) 4 MG TBPK tablet Take Tapered dose as directed 06/11/17   Sable Feil, PA-C  Multiple Vitamin (MULTIVITAMIN) tablet Take 1 tablet by mouth daily.    [provider]  sertraline (ZOLOFT) 100 MG tablet Take 1 tablet (100 mg total) by mouth daily. 01/11/13   Lucille Passy, MD    Allergies Penicillins  Family History  Problem Relation Age of Onset  . Hypothyroidism Mother   . Hypothyroidism Maternal Aunt   . Cancer Paternal Aunt 12       breast CA    Social History Social History  Substance Use Topics  . Smoking status: Never Smoker  . Smokeless tobacco: Never Used  . Alcohol use Yes     Comment: occassional    Review of Systems  Constitutional: No fever/chills Eyes: No visual changes. ENT: No sore throat. Cardiovascular: Denies chest pain. Respiratory: Denies shortness of breath. Gastrointestinal: No abdominal pain.  No nausea, no vomiting.  No diarrhea.  No constipation. Genitourinary: Negative for dysuria. Musculoskeletal: Negative for back pain. Skin: Negative for rash. Neurological: Negative for headaches, focal weakness or numbness. Psychiatric:Anxiety and depression Allergic/Immunilogical: Penicillin ____________________________________________   PHYSICAL EXAM:  VITAL SIGNS: ED Triage Vitals [06/11/17 1939]  Enc Vitals Group     BP (!) 144/89     Pulse Rate 82     Resp 18     Temp 98.7 F (37.1 C)  Temp Source Oral     SpO2 100 %     Weight 285 lb (129.3 kg)     Height 5\' 11"  (1.803 m)     Head Circumference      Peak Flow      Pain Score 5     Pain Loc      Pain Edu?      Excl. in [place]?     Constitutional: Alert and oriented. Well appearing and in no acute distress. Cardiovascular: Normal rate, regular rhythm. Grossly normal heart sounds.  Good peripheral circulation. Respiratory: Normal respiratory effort.  No retractions. Lungs  CTAB. Gastrointestinal: Soft and nontender. No distention. No abdominal bruits. No CVA tenderness. Musculoskeletal: No lower extremity tenderness nor edema.  No joint effusions. Neurologic:  Normal speech and language. No gross focal neurologic deficits are appreciated. No gait instability. Skin:  Skin is warm, dry and intact. Erythematous macular lesion left forearm. Psychiatric: Mood and affect are normal. Speech and behavior are normal.  ____________________________________________   LABS (all labs ordered are listed, but only abnormal results are displayed)  Labs Reviewed - No data to display ____________________________________________  EKG   ____________________________________________  RADIOLOGY  No results found.  ____________________________________________   PROCEDURES  Procedure(s) performed: None  Procedures  Critical Care performed: No  ____________________________________________   INITIAL IMPRESSION / ASSESSMENT AND PLAN / ED COURSE   Insect bite left forearm. Patient given discharge care instructions. Patient given a prescription for Medrol dosepak and Atarax. Patient advised follow-up with PCP if no improvement 3 days. Return by ER for condition worsens.      ____________________________________________   FINAL CLINICAL IMPRESSION(S) / ED DIAGNOSES  Final diagnoses:  Insect bite, initial encounter      NEW MEDICATIONS STARTED DURING THIS VISIT:  New Prescriptions   HYDROXYZINE (ATARAX/VISTARIL) 50 MG TABLET    Take 1 tablet (50 mg total) by mouth 3 (three) times daily as needed for itching.   METHYLPREDNISOLONE (MEDROL DOSEPAK) 4 MG TBPK TABLET    Take Tapered dose as directed     Note:  This document was prepared using Dragon voice recognition software and may include unintentional dictation errors.    Sable Feil, PA-C 06/11/17 2049    Carrie Mew, MD 06/11/17 [phone]

## 2018-11-02 ENCOUNTER — Emergency Department: Payer: BLUE CROSS/BLUE SHIELD

## 2018-11-02 DIAGNOSIS — Z5321 Procedure and treatment not carried out due to patient leaving prior to being seen by health care provider: Secondary | ICD-10-CM | POA: Insufficient documentation

## 2018-11-02 DIAGNOSIS — R079 Chest pain, unspecified: Secondary | ICD-10-CM | POA: Insufficient documentation

## 2018-11-02 LAB — TROPONIN I

## 2018-11-02 LAB — CBC
HEMATOCRIT: 42 % (ref 36.0–46.0)
Hemoglobin: 13.6 g/dL (ref 12.0–15.0)
MCH: 28.5 pg (ref 26.0–34.0)
MCHC: 32.4 g/dL (ref 30.0–36.0)
MCV: 87.9 fL (ref 80.0–100.0)
NRBC: 0 % (ref 0.0–0.2)
PLATELETS: 281 10*3/uL (ref 150–400)
RBC: 4.78 MIL/uL (ref 3.87–5.11)
RDW: 12.8 % (ref 11.5–15.5)
WBC: 11.1 10*3/uL — AB (ref 4.0–10.5)

## 2018-11-02 LAB — BASIC METABOLIC PANEL
Anion gap: 8 (ref 5–15)
BUN: 17 mg/dL (ref 6–20)
CALCIUM: 9 mg/dL (ref 8.9–10.3)
CHLORIDE: 103 mmol/L (ref 98–111)
CO2: 26 mmol/L (ref 22–32)
CREATININE: 0.87 mg/dL (ref 0.44–1.00)
GFR calc Af Amer: >60 mL/min (ref >60)
Glucose, Bld: 97 mg/dL (ref 70–99)
Potassium: 3.9 mmol/L (ref 3.5–5.1)
SODIUM: 137 mmol/L (ref 135–145)

## 2018-11-02 LAB — POCT PREGNANCY, URINE: PREG TEST UR: NEGATIVE

## 2018-11-02 NOTE — ED Triage Notes (Signed)
Patient c/o medial chest pain radiating to left arm and neck. Patient reports her HR was 168 at work - patient reports palpitations.

## 2018-11-03 ENCOUNTER — Emergency Department
Admission: EM | Admit: 2018-11-03 | Discharge: 2018-11-03 | Discharge status: Against Medical Advice | Payer: BLUE CROSS/BLUE SHIELD | Attending: Emergency Medicine | Admitting: Emergency Medicine

## 2018-11-03 NOTE — ED Notes (Signed)
Patient up to stat desk reporting that she is going to leave without being seen. Patient cautioned against the dangers of leaving without further treatment/assessment. Patient verbalized understanding of information discussed. Patient left.

## 2019-09-11 DIAGNOSIS — M5432 Sciatica, left side: Secondary | ICD-10-CM | POA: Diagnosis not present

## 2019-09-11 DIAGNOSIS — M545 Low back pain: Secondary | ICD-10-CM | POA: Diagnosis not present

## 2019-09-11 DIAGNOSIS — M9903 Segmental and somatic dysfunction of lumbar region: Secondary | ICD-10-CM | POA: Diagnosis not present

## 2019-09-11 DIAGNOSIS — M5431 Sciatica, right side: Secondary | ICD-10-CM | POA: Diagnosis not present

## 2019-09-13 DIAGNOSIS — M545 Low back pain: Secondary | ICD-10-CM | POA: Diagnosis not present

## 2019-09-13 DIAGNOSIS — M9903 Segmental and somatic dysfunction of lumbar region: Secondary | ICD-10-CM | POA: Diagnosis not present

## 2019-09-13 DIAGNOSIS — M5432 Sciatica, left side: Secondary | ICD-10-CM | POA: Diagnosis not present

## 2019-09-13 DIAGNOSIS — M5431 Sciatica, right side: Secondary | ICD-10-CM | POA: Diagnosis not present

## 2019-09-22 DIAGNOSIS — J209 Acute bronchitis, unspecified: Secondary | ICD-10-CM | POA: Diagnosis not present

## 2019-09-22 DIAGNOSIS — R071 Chest pain on breathing: Secondary | ICD-10-CM | POA: Diagnosis not present

## 2019-10-29 DIAGNOSIS — G4700 Insomnia, unspecified: Secondary | ICD-10-CM | POA: Diagnosis not present

## 2019-10-29 DIAGNOSIS — Z6841 Body Mass Index (BMI) 40.0 and over, adult: Secondary | ICD-10-CM | POA: Diagnosis not present

## 2019-10-29 DIAGNOSIS — F339 Major depressive disorder, recurrent, unspecified: Secondary | ICD-10-CM | POA: Diagnosis not present

## 2019-10-29 DIAGNOSIS — F419 Anxiety disorder, unspecified: Secondary | ICD-10-CM | POA: Diagnosis not present

## 2019-12-10 DIAGNOSIS — E7800 Pure hypercholesterolemia, unspecified: Secondary | ICD-10-CM | POA: Diagnosis not present

## 2019-12-10 DIAGNOSIS — F419 Anxiety disorder, unspecified: Secondary | ICD-10-CM | POA: Diagnosis not present

## 2019-12-10 DIAGNOSIS — E559 Vitamin D deficiency, unspecified: Secondary | ICD-10-CM | POA: Diagnosis not present

## 2019-12-10 DIAGNOSIS — E785 Hyperlipidemia, unspecified: Secondary | ICD-10-CM | POA: Diagnosis not present

## 2019-12-10 DIAGNOSIS — R5383 Other fatigue: Secondary | ICD-10-CM | POA: Diagnosis not present

## 2019-12-10 DIAGNOSIS — F339 Major depressive disorder, recurrent, unspecified: Secondary | ICD-10-CM | POA: Diagnosis not present

## 2019-12-25 DIAGNOSIS — N951 Menopausal and female climacteric states: Secondary | ICD-10-CM | POA: Diagnosis not present

## 2019-12-25 DIAGNOSIS — Z01419 Encounter for gynecological examination (general) (routine) without abnormal findings: Secondary | ICD-10-CM | POA: Diagnosis not present

## 2019-12-25 DIAGNOSIS — Z1151 Encounter for screening for human papillomavirus (HPV): Secondary | ICD-10-CM | POA: Diagnosis not present

## 2019-12-25 DIAGNOSIS — R5383 Other fatigue: Secondary | ICD-10-CM | POA: Diagnosis not present

## 2019-12-25 DIAGNOSIS — Z1389 Encounter for screening for other disorder: Secondary | ICD-10-CM | POA: Diagnosis not present

## 2019-12-25 DIAGNOSIS — Z6841 Body Mass Index (BMI) 40.0 and over, adult: Secondary | ICD-10-CM | POA: Diagnosis not present

## 2019-12-25 DIAGNOSIS — Z0000 Encounter for general adult medical examination without abnormal findings: Secondary | ICD-10-CM | POA: Diagnosis not present

## 2020-02-04 DIAGNOSIS — R110 Nausea: Secondary | ICD-10-CM | POA: Diagnosis not present

## 2020-02-04 DIAGNOSIS — Z3202 Encounter for pregnancy test, result negative: Secondary | ICD-10-CM | POA: Diagnosis not present

## 2020-02-04 DIAGNOSIS — R1110 Vomiting, unspecified: Secondary | ICD-10-CM | POA: Diagnosis not present

## 2020-02-04 DIAGNOSIS — R197 Diarrhea, unspecified: Secondary | ICD-10-CM | POA: Diagnosis not present

## 2020-02-04 DIAGNOSIS — R5383 Other fatigue: Secondary | ICD-10-CM | POA: Diagnosis not present

## 2020-06-10 ENCOUNTER — Other Ambulatory Visit: Payer: Self-pay | Admitting: Physician Assistant

## 2020-06-10 DIAGNOSIS — R41840 Attention and concentration deficit: Secondary | ICD-10-CM | POA: Diagnosis not present

## 2020-06-10 DIAGNOSIS — Z79899 Other long term (current) drug therapy: Secondary | ICD-10-CM | POA: Diagnosis not present

## 2020-06-10 DIAGNOSIS — F419 Anxiety disorder, unspecified: Secondary | ICD-10-CM | POA: Diagnosis not present

## 2020-06-10 DIAGNOSIS — F902 Attention-deficit hyperactivity disorder, combined type: Secondary | ICD-10-CM | POA: Diagnosis not present

## 2020-06-15 ENCOUNTER — Other Ambulatory Visit: Payer: Self-pay | Admitting: Internal Medicine

## 2020-06-26 ENCOUNTER — Other Ambulatory Visit: Payer: Self-pay | Admitting: Physician Assistant

## 2020-07-09 DIAGNOSIS — F419 Anxiety disorder, unspecified: Secondary | ICD-10-CM | POA: Diagnosis not present

## 2020-07-09 DIAGNOSIS — F902 Attention-deficit hyperactivity disorder, combined type: Secondary | ICD-10-CM | POA: Diagnosis not present

## 2020-07-09 DIAGNOSIS — Z79899 Other long term (current) drug therapy: Secondary | ICD-10-CM | POA: Diagnosis not present

## 2020-07-29 ENCOUNTER — Other Ambulatory Visit: Payer: Self-pay | Admitting: Physician Assistant

## 2020-08-18 DIAGNOSIS — M13842 Other specified arthritis, left hand: Secondary | ICD-10-CM | POA: Diagnosis not present

## 2020-08-26 ENCOUNTER — Other Ambulatory Visit: Payer: Self-pay | Admitting: Physician Assistant

## 2020-09-29 ENCOUNTER — Other Ambulatory Visit: Payer: Self-pay | Admitting: Physician Assistant

## 2020-10-09 ENCOUNTER — Other Ambulatory Visit: Payer: Self-pay | Admitting: Physician Assistant

## 2020-10-09 DIAGNOSIS — F902 Attention-deficit hyperactivity disorder, combined type: Secondary | ICD-10-CM | POA: Diagnosis not present

## 2020-10-09 DIAGNOSIS — Z79899 Other long term (current) drug therapy: Secondary | ICD-10-CM | POA: Diagnosis not present

## 2020-10-09 DIAGNOSIS — F419 Anxiety disorder, unspecified: Secondary | ICD-10-CM | POA: Diagnosis not present

## 2020-10-30 ENCOUNTER — Other Ambulatory Visit: Payer: Self-pay

## 2020-10-30 DIAGNOSIS — R109 Unspecified abdominal pain: Secondary | ICD-10-CM | POA: Diagnosis not present

## 2020-10-30 DIAGNOSIS — N390 Urinary tract infection, site not specified: Secondary | ICD-10-CM | POA: Diagnosis not present

## 2020-12-15 ENCOUNTER — Other Ambulatory Visit: Payer: Self-pay

## 2020-12-17 ENCOUNTER — Other Ambulatory Visit: Payer: Self-pay

## 2020-12-18 ENCOUNTER — Other Ambulatory Visit: Payer: Self-pay

## 2020-12-18 MED ORDER — VYVANSE 40 MG PO CAPS
ORAL_CAPSULE | ORAL | 0 refills | Status: DC
  Filled 2020-12-18: qty 30, 30d supply, fill #0

## 2021-01-13 DIAGNOSIS — D2262 Melanocytic nevi of left upper limb, including shoulder: Secondary | ICD-10-CM | POA: Diagnosis not present

## 2021-01-13 DIAGNOSIS — L814 Other melanin hyperpigmentation: Secondary | ICD-10-CM | POA: Diagnosis not present

## 2021-01-13 DIAGNOSIS — D225 Melanocytic nevi of trunk: Secondary | ICD-10-CM | POA: Diagnosis not present

## 2021-01-13 DIAGNOSIS — L728 Other follicular cysts of the skin and subcutaneous tissue: Secondary | ICD-10-CM | POA: Diagnosis not present

## 2021-01-13 DIAGNOSIS — D2271 Melanocytic nevi of right lower limb, including hip: Secondary | ICD-10-CM | POA: Diagnosis not present

## 2021-01-13 DIAGNOSIS — X32XXXA Exposure to sunlight, initial encounter: Secondary | ICD-10-CM | POA: Diagnosis not present

## 2021-01-13 DIAGNOSIS — R208 Other disturbances of skin sensation: Secondary | ICD-10-CM | POA: Diagnosis not present

## 2021-01-13 DIAGNOSIS — D2261 Melanocytic nevi of right upper limb, including shoulder: Secondary | ICD-10-CM | POA: Diagnosis not present

## 2021-01-13 DIAGNOSIS — L7211 Pilar cyst: Secondary | ICD-10-CM | POA: Diagnosis not present

## 2021-01-13 DIAGNOSIS — D2272 Melanocytic nevi of left lower limb, including hip: Secondary | ICD-10-CM | POA: Diagnosis not present

## 2021-01-14 ENCOUNTER — Other Ambulatory Visit: Payer: Self-pay

## 2021-01-14 DIAGNOSIS — F902 Attention-deficit hyperactivity disorder, combined type: Secondary | ICD-10-CM | POA: Diagnosis not present

## 2021-01-14 DIAGNOSIS — F419 Anxiety disorder, unspecified: Secondary | ICD-10-CM | POA: Diagnosis not present

## 2021-01-14 DIAGNOSIS — Z79899 Other long term (current) drug therapy: Secondary | ICD-10-CM | POA: Diagnosis not present

## 2021-01-14 MED ORDER — VYVANSE 40 MG PO CAPS
ORAL_CAPSULE | ORAL | 0 refills | Status: DC
  Filled 2021-01-14: qty 30, 30d supply, fill #0

## 2021-01-18 ENCOUNTER — Other Ambulatory Visit: Payer: Self-pay

## 2021-01-18 MED FILL — Bupropion HCl Tab ER 24HR 150 MG: ORAL | 90 days supply | Qty: 90 | Fill #0 | Status: AC

## 2021-01-25 ENCOUNTER — Other Ambulatory Visit: Payer: Self-pay | Admitting: Surgery

## 2021-01-26 ENCOUNTER — Other Ambulatory Visit: Payer: Self-pay

## 2021-01-26 ENCOUNTER — Other Ambulatory Visit
Admission: RE | Admit: 2021-01-26 | Discharge: 2021-01-26 | Disposition: A | Discharge status: Home / Self Care | Payer: 59 | Attending: Surgery | Admitting: Surgery

## 2021-01-26 LAB — CBC WITH DIFFERENTIAL/PLATELET
Abs Immature Granulocytes: 0.04 10*3/uL (ref 0.00–0.07)
Basophils Absolute: 0 10*3/uL (ref 0.0–0.1)
Basophils Relative: 0 %
Eosinophils Absolute: 0.1 10*3/uL (ref 0.0–0.5)
Eosinophils Relative: 1 %
HCT: 39.5 % (ref 36.0–46.0)
Hemoglobin: 12.9 g/dL (ref 12.0–15.0)
Immature Granulocytes: 1 %
Lymphocytes Relative: 21 %
Lymphs Abs: 1.8 10*3/uL (ref 0.7–4.0)
MCH: 28.9 pg (ref 26.0–34.0)
MCHC: 32.7 g/dL (ref 30.0–36.0)
MCV: 88.4 fL (ref 80.0–100.0)
Monocytes Absolute: 0.4 10*3/uL (ref 0.1–1.0)
Monocytes Relative: 5 %
Neutro Abs: 6.2 10*3/uL (ref 1.7–7.7)
Neutrophils Relative %: 72 %
Platelets: 270 10*3/uL (ref 150–400)
RBC: 4.47 MIL/uL (ref 3.87–5.11)
RDW: 13 % (ref 11.5–15.5)
WBC: 8.6 10*3/uL (ref 4.0–10.5)
nRBC: 0 % (ref 0.0–0.2)

## 2021-01-26 LAB — COMPREHENSIVE METABOLIC PANEL
ALT: 28 U/L (ref 0–44)
AST: 24 U/L (ref 15–41)
Albumin: 4.1 g/dL (ref 3.5–5.0)
Alkaline Phosphatase: 66 U/L (ref 38–126)
Anion gap: 10 (ref 5–15)
BUN: 15 mg/dL (ref 6–20)
CO2: 26 mmol/L (ref 22–32)
Calcium: 9.3 mg/dL (ref 8.9–10.3)
Chloride: 102 mmol/L (ref 98–111)
Creatinine, Ser: 0.86 mg/dL (ref 0.44–1.00)
GFR, Estimated: >60 mL/min (ref >60)
Glucose, Bld: 100 mg/dL — ABNORMAL HIGH (ref 70–99)
Potassium: 4 mmol/L (ref 3.5–5.1)
Sodium: 138 mmol/L (ref 135–145)
Total Bilirubin: 0.6 mg/dL (ref 0.3–1.2)
Total Protein: 7.3 g/dL (ref 6.5–8.1)

## 2021-01-26 LAB — VITAMIN B12: Vitamin B-12: 365 pg/mL (ref 180–914)

## 2021-01-26 LAB — LIPID PANEL
Cholesterol: 150 mg/dL (ref 0–200)
HDL: 41 mg/dL (ref >40)
LDL Cholesterol: 88 mg/dL (ref 0–99)
Total CHOL/HDL Ratio: 3.7 RATIO
Triglycerides: 105 mg/dL (ref <150)
VLDL: 21 mg/dL (ref 0–40)

## 2021-01-26 LAB — IRON AND TIBC
Iron: 55 ug/dL (ref 28–170)
Saturation Ratios: 14 % (ref 10.4–31.8)
TIBC: 395 ug/dL (ref 250–450)
UIBC: 340 ug/dL

## 2021-01-26 LAB — TSH: TSH: 1.198 u[IU]/mL (ref 0.350–4.500)

## 2021-01-26 LAB — VITAMIN D 25 HYDROXY (VIT D DEFICIENCY, FRACTURES): Vit D, 25-Hydroxy: 45.27 ng/mL (ref 30–100)

## 2021-01-26 LAB — HCG, QUANTITATIVE, PREGNANCY: hCG, Beta Chain, Quant, S: <1 m[IU]/mL (ref <5)

## 2021-01-27 LAB — HEMOGLOBIN A1C
Hgb A1c MFr Bld: 5.2 % (ref 4.8–5.6)
Mean Plasma Glucose: 103 mg/dL

## 2021-01-27 LAB — H. PYLORI BREATH TEST: H. pylori UBiT: NEGATIVE

## 2021-02-01 LAB — VITAMIN B1: Vitamin B1 (Thiamine): 162.6 nmol/L (ref 66.5–200.0)

## 2021-02-04 ENCOUNTER — Ambulatory Visit
Admission: RE | Admit: 2021-02-04 | Discharge: 2021-02-04 | Disposition: A | Discharge status: Home / Self Care | Payer: 59 | Source: Ambulatory Visit | Attending: Surgery | Admitting: Surgery

## 2021-02-04 ENCOUNTER — Other Ambulatory Visit: Payer: Self-pay

## 2021-02-04 DIAGNOSIS — Z136 Encounter for screening for cardiovascular disorders: Secondary | ICD-10-CM | POA: Diagnosis not present

## 2021-02-11 DIAGNOSIS — F509 Eating disorder, unspecified: Secondary | ICD-10-CM | POA: Diagnosis not present

## 2021-03-01 ENCOUNTER — Other Ambulatory Visit: Payer: Self-pay

## 2021-03-02 ENCOUNTER — Other Ambulatory Visit: Payer: Self-pay

## 2021-03-02 MED ORDER — VYVANSE 40 MG PO CAPS
40.0000 mg | ORAL_CAPSULE | Freq: Every day | ORAL | 0 refills | Status: DC
  Filled 2021-03-02: qty 30, 30d supply, fill #0

## 2021-03-04 ENCOUNTER — Ambulatory Visit: Payer: BLUE CROSS/BLUE SHIELD | Admitting: Skilled Nursing Facility1

## 2021-03-05 ENCOUNTER — Other Ambulatory Visit: Payer: Self-pay

## 2021-03-11 ENCOUNTER — Encounter: Payer: Self-pay | Admitting: Dietician

## 2021-03-11 ENCOUNTER — Other Ambulatory Visit: Payer: Self-pay

## 2021-03-11 ENCOUNTER — Encounter: Payer: 59 | Attending: Surgery | Admitting: Dietician

## 2021-03-11 DIAGNOSIS — Z6841 Body Mass Index (BMI) 40.0 and over, adult: Secondary | ICD-10-CM | POA: Insufficient documentation

## 2021-03-11 NOTE — Progress Notes (Signed)
Nutrition Assessment for Bariatric Surgery   Appointment start time: 0915   end time: 1025  Planned Surgery: Sleeve Gastrectomy  Anthropometrics: Weight: 349lbs Height: 5'11" BMI: 48.68   Patient's Goal Weight: 190-200lbs  Clinical: Medical History: depression, ADHD Medications and Supplements: vyvanse, Wellbutrin Relevant labs: 01/26/21 HbA1C 5.2%; total cholesterol 150, Triglycerides 105, HDL 41, LDL 88 Notable symptoms: no symptoms Drug allergies: none known Food allergies: none known  Lifestyle and Dietary History:  Dieting/ weight history:   Patient reports overweight since about 3rd grade.  She has tried multiple diets in the past, most recently weight watchers, but had difficulty maintaining food logs and documentation Used phentermine and lost 70lbs but regained 80lbs when she stopped it even while maintaining eating pattern Has begun working to decrease sugar from beverages, has realized she consumes significant "liquid calories" Reports having some food jags and will eat the same meal for 2 or more weeks but then will change.   Disordered or emotional eating history:  No eating disorder.  Depression supresses appetite, but tends to eat more when recovering.   Physical activity: none currently  Dietary Recall:  Daily pattern: 2-3 meals and 0-3 snacks. Dining out: 5-8 meals per week, some from cafeteria. Breakfast: premier protein shake + banana; omelet; oatmeal occ with sausage + coffee daily now with sugar free flavoring/ sweetener  Snack: none or same as pm Lunch: ocworkers generally order takeout; starting to bring leftovers from home; 7/6 personal pizza with veg topping ie kale (230kcal from [facility] cafe); skips lunch 3-4 times a week Snack: occasional sm bag cookies; sometimes several during day when home Supper: pt/ dtr/ or mom cook -- small portions or cereal on workdays due to late time (after 7pm). When off -- pork chop fried; speghettie; meat loaf and green  beans; chicken and rice or pasta; burgers; cream cheese cucumber sandwich; variety Snack: none Beverages: 2-3 glasses water, coffee in am, sugar free Nature's Twist, diet green tea. Has plans to increase water.   Nutrition Intervention: Instructed patient on pre-op diet goals and importance of close adherence to bariatric diet after surgery to avoid side effects and complications.  Discussed stages of the bariatric diet after surgery as well as the importance of adequate protein and fluid intake.  Provided overview of 2-week pre-op diet.  Discussed emotional eating and strategies to control emotional eating as well as importance of finding non-food ways to manage stress.  Nutrition Diagnosis: [place]-3.3 Overweight/ obesity related to history of excess calories and inadequate physical activity as evidenced by patient with current BMI of 48.68, following dietary guidelines for weight loss prior to bariatric surgery.  Teaching method(s) utilized: Copy provided: Pre-op Goals Diet Stage Template Tips for managing food cravings  Learning Readiness: Change in progress  Barriers to learning/ implementing lifestyle change: none  Demonstrated degree of understanding via: Teach Back  Summary: Patient has begun making healthy and appropriate diet and lifestyle changes in effort to lose weight and prepare for bariatric surgery. She has researched weight loss surgery for about 2 years. Patient's goal for weight loss is weight of 190-200lbs; health risk reduction. She has solid support from family and friends, including her pre-teen and teenage children.  She agrees to work on weaning off caffeine, managing emotional (boredom) eating, and avoiding use of straws prior to surgery.  She voices understanding of, and is motivated to follow the bariatric diet after surgery.  From a nutrition standpoint, she is ready to proceed with the  bariatric surgery program and will be a  good candidate for surgery.    Plan: Patient commits to returning for pre-op class visit prior to surgery.  She will plan to return for post-op RD visits beginning 2 weeks after surgery.

## 2021-03-11 NOTE — Patient Instructions (Signed)
Great job making healthy changes and working on chewing foods well! Keep up the good work! Gradually reduce caffeine in coffee by blending regular and decaf, with more and more decaf and less regular.  Start reducing portions of starchy foods like pasta, rice, and potatoes. Measure portions if needed and keep to 1 cup or less and slowly decrease from there.

## 2021-03-23 DIAGNOSIS — F909 Attention-deficit hyperactivity disorder, unspecified type: Secondary | ICD-10-CM | POA: Insufficient documentation

## 2021-03-30 DIAGNOSIS — Z1152 Encounter for screening for COVID-19: Secondary | ICD-10-CM | POA: Diagnosis not present

## 2021-04-15 ENCOUNTER — Other Ambulatory Visit: Payer: Self-pay

## 2021-04-16 ENCOUNTER — Other Ambulatory Visit: Payer: Self-pay

## 2021-04-19 ENCOUNTER — Other Ambulatory Visit: Payer: Self-pay

## 2021-04-19 MED ORDER — VYVANSE 40 MG PO CAPS
ORAL_CAPSULE | ORAL | 0 refills | Status: DC
  Filled 2021-04-19: qty 30, 30d supply, fill #0

## 2021-04-21 DIAGNOSIS — E559 Vitamin D deficiency, unspecified: Secondary | ICD-10-CM | POA: Diagnosis not present

## 2021-04-21 DIAGNOSIS — R7989 Other specified abnormal findings of blood chemistry: Secondary | ICD-10-CM | POA: Diagnosis not present

## 2021-04-21 DIAGNOSIS — E282 Polycystic ovarian syndrome: Secondary | ICD-10-CM | POA: Diagnosis not present

## 2021-04-21 DIAGNOSIS — Z79899 Other long term (current) drug therapy: Secondary | ICD-10-CM | POA: Diagnosis not present

## 2021-04-21 DIAGNOSIS — N760 Acute vaginitis: Secondary | ICD-10-CM | POA: Diagnosis not present

## 2021-04-21 DIAGNOSIS — D519 Vitamin B12 deficiency anemia, unspecified: Secondary | ICD-10-CM | POA: Diagnosis not present

## 2021-04-21 DIAGNOSIS — E039 Hypothyroidism, unspecified: Secondary | ICD-10-CM | POA: Diagnosis not present

## 2021-04-21 DIAGNOSIS — F902 Attention-deficit hyperactivity disorder, combined type: Secondary | ICD-10-CM | POA: Diagnosis not present

## 2021-04-21 DIAGNOSIS — E119 Type 2 diabetes mellitus without complications: Secondary | ICD-10-CM | POA: Diagnosis not present

## 2021-04-21 DIAGNOSIS — E7800 Pure hypercholesterolemia, unspecified: Secondary | ICD-10-CM | POA: Diagnosis not present

## 2021-04-21 DIAGNOSIS — E118 Type 2 diabetes mellitus with unspecified complications: Secondary | ICD-10-CM | POA: Diagnosis not present

## 2021-04-22 DIAGNOSIS — F509 Eating disorder, unspecified: Secondary | ICD-10-CM | POA: Diagnosis not present

## 2021-04-23 ENCOUNTER — Emergency Department: Payer: 59

## 2021-04-23 ENCOUNTER — Other Ambulatory Visit: Payer: Self-pay

## 2021-04-23 ENCOUNTER — Emergency Department
Admission: EM | Admit: 2021-04-23 | Discharge: 2021-04-23 | Disposition: A | Discharge status: Home / Self Care | Payer: 59 | Attending: Emergency Medicine | Admitting: Emergency Medicine

## 2021-04-23 ENCOUNTER — Encounter: Payer: Self-pay | Admitting: Emergency Medicine

## 2021-04-23 DIAGNOSIS — R202 Paresthesia of skin: Secondary | ICD-10-CM | POA: Diagnosis not present

## 2021-04-23 DIAGNOSIS — Z20822 Contact with and (suspected) exposure to covid-19: Secondary | ICD-10-CM | POA: Insufficient documentation

## 2021-04-23 DIAGNOSIS — R9431 Abnormal electrocardiogram [ECG] [EKG]: Secondary | ICD-10-CM | POA: Diagnosis not present

## 2021-04-23 LAB — BASIC METABOLIC PANEL
Anion gap: 9 (ref 5–15)
BUN: 13 mg/dL (ref 6–20)
CO2: 25 mmol/L (ref 22–32)
Calcium: 8.9 mg/dL (ref 8.9–10.3)
Chloride: 101 mmol/L (ref 98–111)
Creatinine, Ser: 0.98 mg/dL (ref 0.44–1.00)
GFR, Estimated: >60 mL/min (ref >60)
Glucose, Bld: 118 mg/dL — ABNORMAL HIGH (ref 70–99)
Potassium: 3.7 mmol/L (ref 3.5–5.1)
Sodium: 135 mmol/L (ref 135–145)

## 2021-04-23 LAB — URINALYSIS, COMPLETE (UACMP) WITH MICROSCOPIC
Bacteria, UA: NONE SEEN
Bilirubin Urine: NEGATIVE
Glucose, UA: NEGATIVE mg/dL
Hgb urine dipstick: NEGATIVE
Ketones, ur: NEGATIVE mg/dL
Leukocytes,Ua: NEGATIVE
Nitrite: NEGATIVE
Protein, ur: NEGATIVE mg/dL
Specific Gravity, Urine: 1.028 (ref 1.005–1.030)
pH: 5 (ref 5.0–8.0)

## 2021-04-23 LAB — CBC
HCT: 39.1 % (ref 36.0–46.0)
Hemoglobin: 12.9 g/dL (ref 12.0–15.0)
MCH: 29.5 pg (ref 26.0–34.0)
MCHC: 33 g/dL (ref 30.0–36.0)
MCV: 89.3 fL (ref 80.0–100.0)
Platelets: 285 10*3/uL (ref 150–400)
RBC: 4.38 MIL/uL (ref 3.87–5.11)
RDW: 12.9 % (ref 11.5–15.5)
WBC: 8.2 10*3/uL (ref 4.0–10.5)
nRBC: 0 % (ref 0.0–0.2)

## 2021-04-23 LAB — POC URINE PREG, ED: Preg Test, Ur: NEGATIVE

## 2021-04-23 LAB — SARS CORONAVIRUS 2 (TAT 6-24 HRS): SARS Coronavirus 2: NEGATIVE

## 2021-04-23 NOTE — ED Notes (Signed)
Pt ambulatory to restroom independently.

## 2021-04-23 NOTE — ED Notes (Signed)
Pt transported to CT ?

## 2021-04-23 NOTE — ED Provider Notes (Signed)
[facility] Emergency Department Provider Note   ____________________________________________    I have reviewed the triage vital signs and the nursing notes.   HISTORY  Chief Complaint Numbness     HPI Lauren Macias is a 35 y.o. female with a history as noted below who presents with complaints of tingling in her lips and tip of her tongue this morning.  She reports her upper and lower lip were tingling this morning and she felt "off ".  No muscle weakness.  No headache.  No history of the same in the past.  She reports symptoms have improved now.  No new medications.  Past Medical History:  Diagnosis Date   Anxiety    Depression    h/o suicide attempt as teenager, she feels it was caused by lithium   Endometriosis     Patient Active Problem List   Diagnosis Date Noted   Cyst of skin of breast 01/23/2013   Abnormal TSH 01/11/2013   Breast mass, right 01/11/2013   Depression 01/11/2013   Obesity 10/12/2012   Anxiety     Past Surgical History:  Procedure Laterality Date   CYST REMOVAL HAND Left 2007   left finger   [place]  2012   broken finger    Prior to Admission medications   Medication Sig Start Date End Date Taking? Authorizing Provider  buPROPion (WELLBUTRIN XL) 150 MG 24 hr tablet TAKE 1 TABLET BY MOUTH ONCE DAILY 10/09/20 10/09/21  Corine Shelter, PA-C  buPROPion (WELLBUTRIN XL) 150 MG 24 hr tablet TAKE 1 TABLET BY MOUTH ONCE DAILY 06/15/20 06/15/21  Willey Blade, MD  lisdexamfetamine (VYVANSE) 40 MG capsule 1 capsule by mouth daily 04/16/21     Multiple Vitamins-Minerals (MULTIVITAMIN WOMEN PO) multivitamin    [provider]  Probiotic Product (PROBIOTIC DAILY PO) Take by mouth.    [provider]     Allergies Penicillins  Family History  Problem Relation Age of Onset   Hypothyroidism Mother    Hypothyroidism Maternal Aunt    Cancer Paternal Aunt 23        breast CA    Social History Social History   Tobacco Use   Smoking status: Never   Smokeless tobacco: Never  Substance Use Topics   Alcohol use: Yes    Comment: occassional   Drug use: No    Review of Systems  Constitutional: No fever/chills Eyes: No visual changes.  ENT: No sore throat. Cardiovascular: Denies chest pain. Respiratory: Denies shortness of breath. Gastrointestinal: No abdominal pain.  No nausea, no vomiting.   Genitourinary: Negative for dysuria. Musculoskeletal: Negative for back pain. Skin: Negative for rash. Neurological: As above   ____________________________________________   PHYSICAL EXAM:  VITAL SIGNS: ED Triage Vitals [04/23/21 0748]  Enc Vitals Group     BP (!) 141/93     Pulse Rate 100     Resp 18     Temp 98 F (36.7 C)     Temp Source Oral     SpO2 100 %     Weight (!) 155.6 kg (343 lb)     Height 1.803 m ('5\' 11"'$ )     Head Circumference      Peak Flow      Pain Score 1     Pain Loc      Pain Edu?      Excl. in [place]?     Constitutional: Alert and oriented.  Eyes:  Conjunctivae are normal.   Nose: No congestion/rhinnorhea. Mouth/Throat: Mucous membranes are moist.    Cardiovascular: Normal rate, regular rhythm. good peripheral circulation. Respiratory: Normal respiratory effort.  No retractions. . Gastrointestinal: Soft and nontender. No distention.  No CVA tenderness.  Musculoskeletal: No lower extremity tenderness nor edema.  Warm and well perfused Neurologic:  Normal speech and language. No gross focal neurologic deficits are appreciated.  Cranial nerves II through XII are normal Skin:  Skin is warm, dry and intact. No rash noted. Psychiatric: Mood and affect are normal. Speech and behavior are normal.  ____________________________________________   LABS (all labs ordered are listed, but only abnormal results are displayed)  Labs Reviewed  BASIC METABOLIC PANEL - Abnormal; Notable for the following components:       Result Value   Glucose, Bld 118 (*)    All other components within normal limits  URINALYSIS, COMPLETE (UACMP) WITH MICROSCOPIC - Abnormal; Notable for the following components:   Color, Urine YELLOW (*)    APPearance HAZY (*)    Non Squamous Epithelial PRESENT (*)    All other components within normal limits  SARS CORONAVIRUS 2 (TAT 6-24 HRS)  CBC  POC URINE PREG, ED   ____________________________________________  EKG  ED ECG REPORT I, Lavonia Drafts, the attending physician, personally viewed and interpreted this ECG.  Date: 04/23/2021 E Rhythm: normal sinus rhythm QRS Axis: normal Intervals: normal ST/T Wave abnormalities: normal Narrative Interpretation: no evidence of acute ischemia  ____________________________________________  RADIOLOGY  CT head reviewed by me, no acute abnormality ____________________________________________   PROCEDURES  Procedure(s) performed: No  Procedures   Critical Care performed: No ____________________________________________   INITIAL IMPRESSION / ASSESSMENT AND PLAN / ED COURSE  Pertinent labs & imaging results that were available during my care of the patient were reviewed by me and considered in my medical decision making (see chart for details).   Patient well-appearing and in no acute distress, symptoms have resolved while in the emergency department.  Lab work today is quite reassuring, electrolytes are normal.  CT head is unremarkable.  Neuro exam is reassuring  Unclear cause of paresthesias, recommended MRI however patient not willing to wait in the emergency department to do so, she will follow-up with her PCP for outpatient imaging if needed    ____________________________________________   FINAL CLINICAL IMPRESSION(S) / ED DIAGNOSES  Final diagnoses:  Paresthesia        Note:  This document was prepared using Dragon voice recognition software and may include unintentional dictation errors.    Lavonia Drafts, MD 04/23/21 1535

## 2021-04-23 NOTE — ED Triage Notes (Signed)
Pt comes into the ED via POV c/o paraesthesias to the lips and left arm and leg.  Pt states she went to bed last night not feeling "right" and then woke up with the tingling and numbness.  Pt states she also has lost her sense of taste but denies any COVID like symptoms.  Pt neurologically intact at this time.

## 2021-05-06 DIAGNOSIS — R5383 Other fatigue: Secondary | ICD-10-CM | POA: Diagnosis not present

## 2021-05-06 DIAGNOSIS — F339 Major depressive disorder, recurrent, unspecified: Secondary | ICD-10-CM | POA: Diagnosis not present

## 2021-05-06 DIAGNOSIS — G4700 Insomnia, unspecified: Secondary | ICD-10-CM | POA: Diagnosis not present

## 2021-05-06 DIAGNOSIS — G608 Other hereditary and idiopathic neuropathies: Secondary | ICD-10-CM | POA: Diagnosis not present

## 2021-05-06 DIAGNOSIS — Z6841 Body Mass Index (BMI) 40.0 and over, adult: Secondary | ICD-10-CM | POA: Diagnosis not present

## 2021-05-06 DIAGNOSIS — F419 Anxiety disorder, unspecified: Secondary | ICD-10-CM | POA: Diagnosis not present

## 2021-05-06 DIAGNOSIS — M79661 Pain in right lower leg: Secondary | ICD-10-CM | POA: Diagnosis not present

## 2021-05-06 DIAGNOSIS — G603 Idiopathic progressive neuropathy: Secondary | ICD-10-CM | POA: Diagnosis not present

## 2021-05-19 ENCOUNTER — Other Ambulatory Visit: Payer: Self-pay

## 2021-05-19 MED ORDER — VYVANSE 40 MG PO CAPS
ORAL_CAPSULE | ORAL | 0 refills | Status: DC
  Filled 2021-05-19: qty 30, 30d supply, fill #0

## 2021-05-20 ENCOUNTER — Other Ambulatory Visit: Payer: Self-pay

## 2021-05-20 NOTE — Progress Notes (Signed)
Sent message, via epic in basket, requesting orders in epic from surgeon.  

## 2021-05-21 ENCOUNTER — Encounter: Payer: 59 | Attending: Surgery | Admitting: Dietician

## 2021-05-21 ENCOUNTER — Other Ambulatory Visit: Payer: Self-pay

## 2021-05-21 VITALS — Ht 71.0 in | Wt 356.2 lb

## 2021-05-21 DIAGNOSIS — Z6841 Body Mass Index (BMI) 40.0 and over, adult: Secondary | ICD-10-CM | POA: Diagnosis not present

## 2021-05-21 NOTE — Progress Notes (Signed)
Pre-Operative Nutrition Class:  Appt start time: 0915   End time:  0964.  Patient was seen on 05/21/21 for Pre-Operative Bariatric Surgery Education at [facility] at [facility].   Surgery date: 06/07/21 Surgery type: Sleeve gastrectomy Start weight at [facility]: 349lbs 03/11/21 Weight today: 356.2lbs  InBody  BODY COMP RESULTS    BMI (kg/m^2) 49.7  Fat Mass (lbs) 180.7  Dry Lean Mass (lbs) 47.0  Total Body Water (lbs) 128.5   Samples given per MNT protocol. Patient educated on appropriate usage:  Lot number Expiration date  Celebrate Vitamins Pack:  Calcium soft chews cherry   [id number]   08/2021  Calcium soft chews watermelon 1165 08/2021  Calcium soft chews straw-ban cream 1173 08/2021  Calcium soft chews cafe mocha 1166 08/2021  Calcium soft chews orange 1098 06/2021  Calcium soft chews blackberry 1166 08/2021  Calcium soft chews caramel 1130 07/2021  Calcium soft chews chocolate 1097 06/2021  Calcium soft chews island fruit 1095 06/2021  Calcium soft chews lemon cream 1123 07/2021      Iron soft chews 41m cherry burst [id number] 01/2022  Iron soft chews 640mtwisted citrus [id number]/2023               Unjury products:  Vanilla shake   [id number]    07/03/21  Unflavored powder [id number]   07/2021  Chicken soup powder [id number] 07/2021        The following the learning objectives were met by the patient during this course: Identify Pre-Op Dietary Goals and will begin 2 weeks pre-operatively Identify appropriate sources of fluids and proteins  State protein recommendations and appropriate sources pre and post-operatively Identify Post-Operative Dietary Goals and will follow for 2 weeks post-operatively Identify appropriate multivitamin and calcium sources Describe the need for physical activity post-operatively and will follow MD recommendations State when to call healthcare provider regarding medication questions or post-operative  complications  Handouts given during class include: Pre-Op Bariatric Surgery Diet Handout Protein Shake Handout Post-Op Bariatric Surgery Nutrition Handout BELT Program Information Flyer Support Group Information Flyer [facility] Bariatric Supplements Price List  Follow-Up Plan: [facility] about 2 weeks post operatively for diet advancement per MD.

## 2021-05-28 NOTE — Patient Instructions (Signed)
DUE TO COVID-19 ONLY ONE VISITOR IS ALLOWED TO COME WITH YOU AND STAY IN THE WAITING ROOM ONLY DURING PRE OP AND PROCEDURE DAY OF SURGERY IF YOU ARE GOING HOME AFTER SURGERY. IF YOU ARE SPENDING THE NIGHT 2 PEOPLE MAY VISIT WITH YOU IN YOUR PRIVATE ROOM AFTER SURGERY UNTIL VISITING  HOURS ARE OVER AT 8:00 PM AND 1 VISITOR CAN SPEND THE NIGHT.   YOU NEED TO HAVE A COVID 19 TEST ON_9/29_____THIS TEST MUST BE DONE BEFORE SURGERY,  COVID TESTING SITE  IS LOCATED AT [place], . REMAIN IN YOUR CAR THIS IS A DRIVE UP TEST. AFTER YOUR COVID TEST PLEASE WEAR A MASK OUT IN PUBLIC AND SOCIAL DISTANCE AND Lauren Macias YOUR HANDS FREQUENTLY, ALSO ASK ALL YOUR CLOSE CONTACT PERSONS TO WEAR A MASK AND SOCIAL DISTANCE AND Lauren Macias THEIR HANDS FREQUENTLY ALSO.               Lauren Macias     Your procedure is scheduled on: 06/07/21   Report to [facility] Main  Entrance   Report to admitting at   9:45 AM     Call this number if you have problems the morning of surgery [phone]      NO SOLID FOOD AFTER 6:00 PM THE NIGHT BEFORE YOUR SURGERY.   YOU MAY DRINK CLEAR FLUIDS.  Until 9:00 am  Drink the G2 before you leave the house.   PAIN IS EXPECTED AFTER SURGERY AND WILL NOT BE COMPLETELY ELIMINATED.   AMBULATION AND TYLENOL WILL HELP REDUCE INCISIONAL AND GAS PAIN. MOVEMENT IS KEY!  YOU ARE EXPECTED TO BE OUT OF BED WITHIN 4 HOURS OF ADMISSION TO YOUR PATIENT ROOM.  SITTING IN THE RECLINER THROUGHOUT THE DAY IS IMPORTANT FOR DRINKING FLUIDS AND MOVING GAS THROUGHOUT THE GI TRACT.  COMPRESSION STOCKINGS SHOULD BE WORN Lauren Macias UNLESS YOU ARE WALKING.   INCENTIVE SPIROMETER SHOULD BE USED EVERY HOUR WHILE AWAKE TO DECREASE POST-OPERATIVE COMPLICATIONS SUCH AS PNEUMONIA.  WHEN DISCHARGED HOME, IT IS IMPORTANT TO CONTINUE TO WALK EVERY HOUR AND USE THE INCENTIVE SPIROMETER EVERY HOUR.           CLEAR LIQUID DIET   Foods Allowed                                                                      Foods Excluded  Coffee and tea, regular and decaf                             liquids that you cannot  Plain Jell-O any favor except red or purple                                           see through such as: Fruit ices (not with fruit pulp)                                     milk, soups, orange juice  Iced Popsicles  All solid food Carbonated beverages, regular and diet                                    Cranberry, grape and apple juices Sports drinks like Gatorade Lightly seasoned clear broth or consume(fat free  Nothing more by mouth after 9:00 am   BRUSH YOUR TEETH MORNING OF SURGERY AND RINSE YOUR MOUTH OUT, NO CHEWING GUM CANDY OR MINTS.     Take these medicines the morning of surgery with A SIP OF WATER: none                                You may not have any metal on your body including hair pins and              piercings  Do not wear jewelry, make-up, lotions, powders or perfumes, deodorant             Do not wear nail polish on your fingernails.  Do not shave  48 hours prior to surgery.              Men may shave face and neck.   Do not bring valuables to the hospital. Lauren Macias.  Contacts, dentures or bridgework may not be worn into surgery.  Leave suitcase in the car. After surgery it may be brought to your room.               Lauren Macias - Preparing for Surgery Before surgery, you can play an important role.  Because skin is not sterile, your skin needs to be as free of germs as possible.  You can reduce the number of germs on your skin by washing with CHG (chlorahexidine gluconate) soap before surgery.  CHG is an antiseptic cleaner which kills germs and bonds with the skin to continue killing germs even after washing. Please DO NOT use if you have an allergy to CHG or antibacterial soaps.  If your skin becomes reddened/irritated  stop using the CHG and inform your nurse when you arrive at Short Stay. Do not shave (including legs and underarms) for at least 48 hours prior to the first CHG shower.   Please follow these instructions carefully:  1.  Shower with CHG Soap the night before surgery and the  morning of Surgery.  2.  If you choose to wash your hair, wash your hair first as usual with your  normal  shampoo.  3.  After you shampoo, rinse your hair and body thoroughly to remove the  shampoo.                            4.  Use CHG as you would any other liquid soap.  You can apply chg directly  to the skin and wash                       Gently with a scrungie or clean washcloth.  5.  Apply the CHG Soap to your body ONLY FROM THE NECK DOWN.   Do not use on face/ open  Wound or open sores. Avoid contact with eyes, ears mouth and genitals (private parts).                       Wash face,  Genitals (private parts) with your normal soap.             6.  Wash thoroughly, paying special attention to the area where your surgery  will be performed.  7.  Thoroughly rinse your body with warm water from the neck down.  8.  DO NOT shower/wash with your normal soap after using and rinsing off  the CHG Soap.                9.  Pat yourself dry with a clean towel.            10.  Wear clean pajamas.            11.  Place clean sheets on your bed the night of your first shower and do not  sleep with pets. Day of Surgery : Do not apply any lotions/deodorants the morning of surgery.  Please wear clean clothes to the hospital/surgery center.  FAILURE TO FOLLOW THESE INSTRUCTIONS MAY RESULT IN THE CANCELLATION OF YOUR SURGERY PATIENT SIGNATURE_________________________________  NURSE SIGNATURE__________________________________  ________________________________________________________________________   [name]  An incentive spirometer is a tool that can help keep your lungs clear and active.  This tool measures how well you are filling your lungs with each breath. Taking long deep breaths may help reverse or decrease the chance of developing breathing (pulmonary) problems (especially infection) following: A long period of time when you are unable to move or be active. BEFORE THE PROCEDURE  If the spirometer includes an indicator to show your best effort, your nurse or respiratory therapist will set it to a desired goal. If possible, sit up straight or lean slightly forward. Try not to slouch. Hold the incentive spirometer in an upright position. INSTRUCTIONS FOR USE  Sit on the edge of your bed if possible, or sit up as far as you can in bed or on a chair. Hold the incentive spirometer in an upright position. Breathe out normally. Place the mouthpiece in your mouth and seal your lips tightly around it. Breathe in slowly and as deeply as possible, raising the piston or the ball toward the top of the column. Hold your breath for 3-5 seconds or for as long as possible. Allow the piston or ball to fall to the bottom of the column. Remove the mouthpiece from your mouth and breathe out normally. Rest for a few seconds and repeat Steps 1 through 7 at least 10 times every 1-2 hours when you are awake. Take your time and take a few normal breaths between deep breaths. The spirometer may include an indicator to show your best effort. Use the indicator as a goal to work toward during each repetition. After each set of 10 deep breaths, practice coughing to be sure your lungs are clear. If you have an incision (the cut made at the time of surgery), support your incision when coughing by placing a pillow or rolled up towels firmly against it. Once you are able to get out of bed, walk around indoors and cough well. You may stop using the incentive spirometer when instructed by your caregiver.  RISKS AND COMPLICATIONS Take your time so you do not get dizzy or light-headed. If you are in pain, you may  need to take or  ask for pain medication before doing incentive spirometry. It is harder to take a deep breath if you are having pain. AFTER USE Rest and breathe slowly and easily. It can be helpful to keep track of a log of your progress. Your caregiver can provide you with a simple table to help with this. If you are using the spirometer at home, follow these instructions: [place] IF:  You are having difficultly using the spirometer. You have trouble using the spirometer as often as instructed. Your pain medication is not giving enough relief while using the spirometer. You develop fever of 100.5 F (38.1 C) or higher. SEEK IMMEDIATE MEDICAL CARE IF:  You cough up bloody sputum that had not been present before. You develop fever of 102 F (38.9 C) or greater. You develop worsening pain at or near the incision site. MAKE SURE YOU:  Understand these instructions. Will watch your condition. Will get help right away if you are not doing well or get worse. Document Released: 01/02/2007 Document Revised: 11/14/2011 Document Reviewed: 03/05/2007 [facility] Patient Information 2014 [place], [place].   ________________________________________________________________________

## 2021-05-31 ENCOUNTER — Encounter (HOSPITAL_COMMUNITY): Payer: Self-pay

## 2021-05-31 ENCOUNTER — Encounter (HOSPITAL_COMMUNITY)
Admission: RE | Admit: 2021-05-31 | Discharge: 2021-05-31 | Disposition: A | Discharge status: Home / Self Care | Payer: 59 | Source: Ambulatory Visit | Attending: Surgery | Admitting: Surgery

## 2021-05-31 ENCOUNTER — Other Ambulatory Visit: Payer: Self-pay

## 2021-05-31 DIAGNOSIS — Z01812 Encounter for preprocedural laboratory examination: Secondary | ICD-10-CM | POA: Insufficient documentation

## 2021-05-31 HISTORY — DX: Personal history of urinary calculi: Z87.442

## 2021-05-31 HISTORY — DX: Nausea with vomiting, unspecified: R11.2

## 2021-05-31 HISTORY — DX: Other specified postprocedural states: Z98.890

## 2021-05-31 HISTORY — DX: Other complications of anesthesia, initial encounter: T88.59XA

## 2021-05-31 HISTORY — DX: Unspecified osteoarthritis, unspecified site: M19.90

## 2021-05-31 LAB — COMPREHENSIVE METABOLIC PANEL
ALT: 31 U/L (ref 0–44)
AST: 26 U/L (ref 15–41)
Albumin: 4 g/dL (ref 3.5–5.0)
Alkaline Phosphatase: 58 U/L (ref 38–126)
Anion gap: 7 (ref 5–15)
BUN: 11 mg/dL (ref 6–20)
CO2: 25 mmol/L (ref 22–32)
Calcium: 9.3 mg/dL (ref 8.9–10.3)
Chloride: 106 mmol/L (ref 98–111)
Creatinine, Ser: 0.69 mg/dL (ref 0.44–1.00)
GFR, Estimated: >60 mL/min (ref >60)
Glucose, Bld: 98 mg/dL (ref 70–99)
Potassium: 4.8 mmol/L (ref 3.5–5.1)
Sodium: 138 mmol/L (ref 135–145)
Total Bilirubin: 0.7 mg/dL (ref 0.3–1.2)
Total Protein: 7.3 g/dL (ref 6.5–8.1)

## 2021-05-31 LAB — CBC WITH DIFFERENTIAL/PLATELET
Abs Immature Granulocytes: 0.04 10*3/uL (ref 0.00–0.07)
Basophils Absolute: 0 10*3/uL (ref 0.0–0.1)
Basophils Relative: 1 %
Eosinophils Absolute: 0.2 10*3/uL (ref 0.0–0.5)
Eosinophils Relative: 3 %
HCT: 42.8 % (ref 36.0–46.0)
Hemoglobin: 13.9 g/dL (ref 12.0–15.0)
Immature Granulocytes: 1 %
Lymphocytes Relative: 21 %
Lymphs Abs: 1.7 10*3/uL (ref 0.7–4.0)
MCH: 29 pg (ref 26.0–34.0)
MCHC: 32.5 g/dL (ref 30.0–36.0)
MCV: 89.4 fL (ref 80.0–100.0)
Monocytes Absolute: 0.5 10*3/uL (ref 0.1–1.0)
Monocytes Relative: 6 %
Neutro Abs: 5.8 10*3/uL (ref 1.7–7.7)
Neutrophils Relative %: 68 %
Platelets: 283 10*3/uL (ref 150–400)
RBC: 4.79 MIL/uL (ref 3.87–5.11)
RDW: 13.2 % (ref 11.5–15.5)
WBC: 8.2 10*3/uL (ref 4.0–10.5)
nRBC: 0 % (ref 0.0–0.2)

## 2021-05-31 NOTE — Progress Notes (Signed)
COVID test- 06/03/21   PCP - Dr. Ruthe Mannan PA Cardiologist - none  Chest x-ray - no EKG - 04/26/21-epic Stress Test - no ECHO - no Cardiac Cath - no Pacemaker/ICD device last checked:NA  Sleep Study - no CPAP -   Fasting Blood Sugar - NA Checks Blood Sugar _____ times a day  Blood Thinner Instructions:NA Aspirin Instructions: Last Dose:  Anesthesia review: no  Patient denies shortness of breath, fever, cough and chest pain at PAT appointment Pt is able to climb 3 flights of stairs, do housework and ADls without SOB  Patient verbalized understanding of instructions that were given to them at the PAT appointment. Patient was also instructed that they will need to review over the PAT instructions again at home before surgery. yes

## 2021-06-03 ENCOUNTER — Other Ambulatory Visit: Payer: Self-pay | Admitting: Surgery

## 2021-06-03 LAB — SARS CORONAVIRUS 2 (TAT 6-24 HRS): SARS Coronavirus 2: NEGATIVE

## 2021-06-07 ENCOUNTER — Encounter (HOSPITAL_COMMUNITY): Payer: Self-pay | Admitting: Certified Registered Nurse Anesthetist

## 2021-06-07 ENCOUNTER — Encounter (HOSPITAL_COMMUNITY): Payer: Self-pay | Admitting: Surgery

## 2021-06-07 ENCOUNTER — Ambulatory Visit (HOSPITAL_COMMUNITY)
Admission: RE | Admit: 2021-06-07 | Discharge: 2021-06-07 | Disposition: A | Discharge status: Home / Self Care | Payer: 59 | Attending: Surgery | Admitting: Surgery

## 2021-06-07 ENCOUNTER — Encounter (HOSPITAL_COMMUNITY)
Admission: RE | Disposition: A | Discharge status: Home / Self Care | Payer: Self-pay | Source: Home / Self Care | Attending: Surgery

## 2021-06-07 DIAGNOSIS — Z539 Procedure and treatment not carried out, unspecified reason: Secondary | ICD-10-CM | POA: Diagnosis not present

## 2021-06-07 LAB — PREGNANCY, URINE: Preg Test, Ur: NEGATIVE

## 2021-06-07 LAB — TYPE AND SCREEN
ABO/RH(D): A POS
Antibody Screen: NEGATIVE

## 2021-06-07 LAB — ABO/RH: ABO/RH(D): A POS

## 2021-06-07 SURGERY — GASTRECTOMY, SLEEVE, LAPAROSCOPIC
Anesthesia: General

## 2021-06-07 MED ORDER — SCOPOLAMINE 1 MG/3DAYS TD PT72
1.0000 | MEDICATED_PATCH | TRANSDERMAL | Status: DC
  Administered 2021-06-07: 1.5 mg via TRANSDERMAL
  Filled 2021-06-07: qty 1

## 2021-06-07 MED ORDER — GABAPENTIN 300 MG PO CAPS
300.0000 mg | ORAL_CAPSULE | ORAL | Status: AC
  Administered 2021-06-07: 300 mg via ORAL
  Filled 2021-06-07: qty 1

## 2021-06-07 MED ORDER — LACTATED RINGERS IV SOLN: INTRAVENOUS | Status: DC

## 2021-06-07 MED ORDER — HEPARIN SODIUM (PORCINE) 5000 UNIT/ML IJ SOLN
5000.0000 [IU] | INTRAMUSCULAR | Status: AC
  Administered 2021-06-07: 5000 [IU] via SUBCUTANEOUS
  Filled 2021-06-07: qty 1

## 2021-06-07 MED ORDER — CHLORHEXIDINE GLUCONATE 0.12 % MT SOLN
15.0000 mL | Freq: Once | OROMUCOSAL | Status: AC
  Administered 2021-06-07: 15 mL via OROMUCOSAL

## 2021-06-07 MED ORDER — APREPITANT 40 MG PO CAPS
40.0000 mg | ORAL_CAPSULE | ORAL | Status: AC
  Administered 2021-06-07: 40 mg via ORAL
  Filled 2021-06-07: qty 1

## 2021-06-07 MED ORDER — LEVOFLOXACIN IN D5W 750 MG/150ML IV SOLN
750.0000 mg | INTRAVENOUS | Status: DC
  Filled 2021-06-07: qty 150

## 2021-06-07 MED ORDER — CHLORHEXIDINE GLUCONATE CLOTH 2 % EX PADS: 6.0000 | MEDICATED_PAD | Freq: Once | CUTANEOUS | Status: DC

## 2021-06-07 MED ORDER — BUPIVACAINE LIPOSOME 1.3 % IJ SUSP: 20.0000 mL | Freq: Once | INTRAMUSCULAR | Status: DC

## 2021-06-07 MED ORDER — ACETAMINOPHEN 500 MG PO TABS
1000.0000 mg | ORAL_TABLET | ORAL | Status: AC
  Administered 2021-06-07: 1000 mg via ORAL
  Filled 2021-06-07: qty 2

## 2021-06-07 MED ORDER — ORAL CARE MOUTH RINSE: 15.0000 mL | Freq: Once | OROMUCOSAL | Status: AC

## 2021-06-07 SURGICAL SUPPLY — 64 items
APPLICATOR COTTON TIP 6 STRL (MISCELLANEOUS) IMPLANT
APPLICATOR COTTON TIP 6IN STRL (MISCELLANEOUS)
APPLIER CLIP 5 13 M/L LIGAMAX5 (MISCELLANEOUS)
APPLIER CLIP ROT 10 11.4 M/L (STAPLE)
APPLIER CLIP ROT 13.4 12 LRG (CLIP)
BAG COUNTER SPONGE SURGICOUNT (BAG) IMPLANT
BAG SURGICOUNT SPONGE COUNTING (BAG)
BLADE SURG 15 STRL LF DISP TIS (BLADE) ×1 IMPLANT
BLADE SURG 15 STRL SS (BLADE) ×2
CABLE HIGH FREQUENCY MONO STRZ (ELECTRODE) ×3 IMPLANT
CLIP APPLIE 5 13 M/L LIGAMAX5 (MISCELLANEOUS) IMPLANT
CLIP APPLIE ROT 10 11.4 M/L (STAPLE) IMPLANT
CLIP APPLIE ROT 13.4 12 LRG (CLIP) IMPLANT
DECANTER SPIKE VIAL GLASS SM (MISCELLANEOUS) ×3 IMPLANT
DERMABOND ADVANCED (GAUZE/BANDAGES/DRESSINGS)
DERMABOND ADVANCED .7 DNX12 (GAUZE/BANDAGES/DRESSINGS) IMPLANT
DEVICE SUT QUICK LOAD TK 5 (STAPLE) IMPLANT
DEVICE SUT TI-KNOT TK 5X26 (MISCELLANEOUS) IMPLANT
DEVICE SUTURE ENDOST 10MM (ENDOMECHANICALS) IMPLANT
DEVICE TI KNOT TK5 (MISCELLANEOUS)
DISSECTOR BLUNT TIP ENDO 5MM (MISCELLANEOUS) IMPLANT
ELECT REM PT RETURN 15FT ADLT (MISCELLANEOUS) ×3 IMPLANT
GAUZE SPONGE 4X4 12PLY STRL (GAUZE/BANDAGES/DRESSINGS) IMPLANT
GLOVE SURG ENC TEXT LTX SZ8 (GLOVE) ×3 IMPLANT
GOWN STRL REUS W/TWL XL LVL3 (GOWN DISPOSABLE) ×12 IMPLANT
GRASPER SUT TROCAR 14GX15 (MISCELLANEOUS) ×3 IMPLANT
HANDLE STAPLE EGIA 4 XL (STAPLE) ×3 IMPLANT
KIT BASIN OR (CUSTOM PROCEDURE TRAY) ×3 IMPLANT
KIT TURNOVER KIT A (KITS) ×3 IMPLANT
MARKER SKIN DUAL TIP RULER LAB (MISCELLANEOUS) ×3 IMPLANT
MAT PREVALON FULL STRYKER (MISCELLANEOUS) ×3 IMPLANT
NEEDLE SPNL 22GX3.5 QUINCKE BK (NEEDLE) ×3 IMPLANT
PACK CARDIOVASCULAR III (CUSTOM PROCEDURE TRAY) ×3 IMPLANT
QUICK LOAD TK 5 (STAPLE)
RELOAD TRI 45 ART MED THCK BLK (STAPLE) ×3 IMPLANT
RELOAD TRI 45 ART MED THCK PUR (STAPLE) IMPLANT
RELOAD TRI 60 ART MED THCK BLK (STAPLE) ×3 IMPLANT
RELOAD TRI 60 ART MED THCK PUR (STAPLE) IMPLANT
SCISSORS LAP 5X45 EPIX DISP (ENDOMECHANICALS) IMPLANT
SET IRRIG TUBING LAPAROSCOPIC (IRRIGATION / IRRIGATOR) ×3 IMPLANT
SET TUBE SMOKE EVAC HIGH FLOW (TUBING) ×3 IMPLANT
SHEARS HARMONIC ACE PLUS 45CM (MISCELLANEOUS) ×3 IMPLANT
SLEEVE ADV FIXATION 5X100MM (TROCAR) ×6 IMPLANT
SLEEVE GASTRECTOMY 36FR VISIGI (MISCELLANEOUS) ×3 IMPLANT
SOL ANTI FOG 6CC (MISCELLANEOUS) ×1 IMPLANT
SOLUTION ANTI FOG 6CC (MISCELLANEOUS) ×2
SPONGE T-LAP 18X18 ~~LOC~~+RFID (SPONGE) ×3 IMPLANT
STAPLER VISISTAT 35W (STAPLE) IMPLANT
SUT MNCRL AB 4-0 PS2 18 (SUTURE) ×6 IMPLANT
SUT SURGIDAC NAB ES-9 0 48 120 (SUTURE) IMPLANT
SUT VICRYL 0 TIES 12 18 (SUTURE) ×3 IMPLANT
SYR 10ML ECCENTRIC (SYRINGE) ×3 IMPLANT
SYR 20ML LL LF (SYRINGE) ×3 IMPLANT
SYR 50ML LL SCALE MARK (SYRINGE) ×3 IMPLANT
TOWEL OR 17X26 10 PK STRL BLUE (TOWEL DISPOSABLE) ×3 IMPLANT
TOWEL OR NON WOVEN STRL DISP B (DISPOSABLE) ×3 IMPLANT
TRAY FOLEY MTR SLVR 16FR STAT (SET/KITS/TRAYS/PACK) IMPLANT
TROCAR ADV FIXATION 5X100MM (TROCAR) ×3 IMPLANT
TROCAR BLADELESS 15MM (ENDOMECHANICALS) ×3 IMPLANT
TROCAR BLADELESS OPT 5 100 (ENDOMECHANICALS) ×3 IMPLANT
TUBE CALIBRATION LAPBAND (TUBING) IMPLANT
TUBING CONNECTING 10 (TUBING) ×4 IMPLANT
TUBING CONNECTING 10' (TUBING) ×2
TUBING ENDO SMARTCAP (MISCELLANEOUS) ×3 IMPLANT

## 2021-06-07 NOTE — Progress Notes (Signed)
Dr.Martin canceled case today, Power issues in the OR and too late to do case also.  Dr.Martin spoke with patient.  Patient visibly upset, crying.  States she might not come back to do surgery.  Walked patient to waiting room to meet sig.other.

## 2021-06-09 ENCOUNTER — Other Ambulatory Visit: Payer: Self-pay | Admitting: *Deleted

## 2021-06-09 ENCOUNTER — Other Ambulatory Visit: Payer: Self-pay

## 2021-06-09 NOTE — Patient Outreach (Addendum)
Lauren Macias [facility]) Care Management  06/09/2021  Lauren Macias 1985-11-22 [id number]   Case Closure  Transition of care  Referral received : 06/07/21 Insurance: [place]   Received referral for transition of care , noted surgery date of 06/07/21 has been rescheduled to 07/06/21.  Plan Will close to [facility] care management services at this time and await new referral for transition of care post discharge.   Joylene Draft, RN, BSN  [place] Management Coordinator  [phone]- Mobile [phone]- Toll Free Main Office

## 2021-06-17 ENCOUNTER — Encounter (HOSPITAL_COMMUNITY): Payer: 59

## 2021-06-18 ENCOUNTER — Ambulatory Visit: Payer: 59 | Admitting: Dietician

## 2021-06-18 ENCOUNTER — Other Ambulatory Visit: Payer: Self-pay

## 2021-06-18 MED ORDER — VYVANSE 40 MG PO CAPS
ORAL_CAPSULE | ORAL | 0 refills | Status: DC
  Filled 2021-06-18: qty 30, 30d supply, fill #0

## 2021-06-22 DIAGNOSIS — F902 Attention-deficit hyperactivity disorder, combined type: Secondary | ICD-10-CM | POA: Diagnosis not present

## 2021-06-22 NOTE — Progress Notes (Signed)
For Short Stay: [place] appointment date: Date of COVID positive in last 45 days:   For Anesthesia: PCP - Remi Haggard, [place] Cardiologist -   Chest x-ray -  EKG - 04/26/21 in epic Stress Test -  ECHO -  Cardiac Cath -  Pacemaker/ICD device last checked:  Sleep Study -  CPAP -   Fasting Blood Sugar -  Checks Blood Sugar _____ times a day  Blood Thinner Instructions: Aspirin Instructions: Last Dose:  Activity level: Can go up a flight of stairs and activities of daily living without stopping and without chest pain and/or shortness of breath   Able to exercise without chest pain and/or shortness of breath   Unable to go up a flight of stairs without chest pain and/or shortness of breath     Anesthesia review:   Patient denies shortness of breath, fever, cough and chest pain at PAT appointment   Patient verbalized understanding of instructions that were given to them at the PAT appointment. Patient was also instructed that they will need to review over the PAT instructions again at home before surgery.

## 2021-07-01 ENCOUNTER — Encounter (HOSPITAL_COMMUNITY): Payer: Self-pay | Admitting: Surgery

## 2021-07-01 ENCOUNTER — Other Ambulatory Visit (HOSPITAL_COMMUNITY): Payer: Self-pay

## 2021-07-01 ENCOUNTER — Other Ambulatory Visit: Payer: Self-pay

## 2021-07-01 NOTE — Progress Notes (Signed)
For Short Stay: [place] appointment date:  07-02-21 at 9:30 [facility]  Date of COVID positive in last 90 days: No    For Anesthesia: PCP - Remi Haggard, [place] Cardiologist -    Chest x-ray -  EKG - 04/26/21 in epic Stress Test -  ECHO -  Cardiac Cath -  Pacemaker/ICD device last checked:   Sleep Study -  CPAP -    Fasting Blood Sugar -  Checks Blood Sugar _____ times a day   Blood Thinner Instructions: Aspirin Instructions: Last Dose:   Activity level: Can go up a flight of stairs and activities of daily living without stopping and without chest pain and/or shortness of breath   Anesthesia review:  N/A   Patient denies shortness of breath, fever, cough and chest pain at PAT appointment     Patient verbalized understanding of instructions that were given to them at the PAT appointment. Patient was also instructed that they will need to review over the PAT instructions again at home before surgery.

## 2021-07-02 ENCOUNTER — Other Ambulatory Visit
Admission: RE | Admit: 2021-07-02 | Discharge: 2021-07-02 | Disposition: A | Discharge status: Home / Self Care | Payer: 59 | Source: Ambulatory Visit | Attending: Surgery | Admitting: Surgery

## 2021-07-02 ENCOUNTER — Ambulatory Visit: Payer: Self-pay | Admitting: Surgery

## 2021-07-02 DIAGNOSIS — Z01812 Encounter for preprocedural laboratory examination: Secondary | ICD-10-CM | POA: Diagnosis not present

## 2021-07-02 DIAGNOSIS — Z20822 Contact with and (suspected) exposure to covid-19: Secondary | ICD-10-CM | POA: Insufficient documentation

## 2021-07-02 LAB — SARS CORONAVIRUS 2 (TAT 6-24 HRS): SARS Coronavirus 2: NEGATIVE

## 2021-07-05 NOTE — H&P (Signed)
DOB: [birth year]  Chief Complaint: morbid obesity for sleeve gastrectomy   History of Present Illness: Lauren Macias is a 35 y.o. female who is seen today for preop for her October 3 sleeve gastrectomy. I requested that we change that to a robotic sleeve gastrectomy since she was posted laparoscopically in room 2. Also she was allergic to penicillin and had that down as an anaphylactic reaction so I already ordered Levaquin. In talking more with her she did take amoxicillin 1 time and tolerated that fine. I suggested that she clarify this with her preop visit so that they can make adjustments to her allergy history so that she is not restricted in the future.  I repeat her physical exam to follow. I answered all of her questions and I will plan to perform her sleeve gastrectomy on October 3..  Review of Systems: See HPI as well for other ROS.  ROS   Medical History: Past Medical History:  Diagnosis Date   Anxiety   There is no problem list on file for this patient.  Past Surgical History:  Procedure Laterality Date   Left hand  4th digit    Allergies  Allergen Reactions   Penicillin Anaphylaxis   Current Outpatient Medications on File Prior to Visit  Medication Sig Dispense Refill   ERGOCALCIFEROL, VITAMIN D2, (VITAMIN D2 ORAL) Take by mouth.   multivitamin tablet Take 1 tablet by mouth once daily.   multivitamin with minerals tablet Take by mouth   phenazopyridine (PYRIDIUM) 100 MG tablet Take 1 tablet (100 mg total) by mouth 3 (three) times daily with meals. 6 tablet 0   VYVANSE 40 mg capsule   No current facility-administered medications on file prior to visit.   Family History  Problem Relation Age of Onset   Obesity Mother   High blood pressure (Hypertension) Father   Hyperlipidemia (Elevated cholesterol) Father   Obesity Brother   Breast cancer Other    Social History   Tobacco Use  Smoking Status Never Smoker  Smokeless Tobacco Never Used    Social  History   Socioeconomic History   Marital status: Single  Tobacco Use   Smoking status: Never Smoker   Smokeless tobacco: Never Used  Substance and Sexual Activity   Alcohol use: Yes   Drug use: Never   Objective:   Vitals:   BP: 128/78  Pulse: 88  Weight: (!) 159.9 kg (352 lb 9.6 oz)  Height: 180.3 cm (5\' 11" )   Body mass index is 49.18 kg/m.  Physical Exam General: Obese white female no acute distress HEENT: Unremarkable Chest : Clear Heart: Sinus rhythm without murmurs or gallops Breast: Not examined Abdomen: Prior laparoscopy for tubal ligation and endometrial ablation GU not examined Recta l not performed Extremities full range of motion Neuro alert and oriented x3. Motor and sensory function grossly intact  Labs, Imaging and Diagnostic Testing:  No lab to review  Assessment and Plan:   Morbid (severe) obesity due to excess calories (CMS-HCC)--for sleeve gastrectomy    All of her questions have been answered regarding robotic access sleeve gastrectomy. This is scheduled for October 3. Her UGI series of June 2 was normal.    Andras Grunewald Donia Pounds, MD

## 2021-07-06 ENCOUNTER — Encounter (HOSPITAL_COMMUNITY): Payer: Self-pay | Admitting: Surgery

## 2021-07-06 ENCOUNTER — Encounter (HOSPITAL_COMMUNITY)
Admission: RE | Disposition: A | Discharge status: Home / Self Care | Payer: Self-pay | Source: Home / Self Care | Attending: Surgery

## 2021-07-06 ENCOUNTER — Inpatient Hospital Stay (HOSPITAL_COMMUNITY): Payer: 59

## 2021-07-06 ENCOUNTER — Other Ambulatory Visit: Payer: Self-pay

## 2021-07-06 ENCOUNTER — Inpatient Hospital Stay (HOSPITAL_COMMUNITY): Payer: 59 | Admitting: Anesthesiology

## 2021-07-06 ENCOUNTER — Inpatient Hospital Stay (HOSPITAL_COMMUNITY)
Admission: RE | Admit: 2021-07-06 | Discharge: 2021-07-07 | DRG: 621 | Disposition: A | Discharge status: Home / Self Care | Payer: 59 | Attending: Surgery | Admitting: Surgery

## 2021-07-06 DIAGNOSIS — Z87442 Personal history of urinary calculi: Secondary | ICD-10-CM | POA: Diagnosis not present

## 2021-07-06 DIAGNOSIS — Z9884 Bariatric surgery status: Secondary | ICD-10-CM

## 2021-07-06 DIAGNOSIS — Z01818 Encounter for other preprocedural examination: Principal | ICD-10-CM

## 2021-07-06 DIAGNOSIS — Z20822 Contact with and (suspected) exposure to covid-19: Secondary | ICD-10-CM | POA: Diagnosis present

## 2021-07-06 DIAGNOSIS — Z8249 Family history of ischemic heart disease and other diseases of the circulatory system: Secondary | ICD-10-CM

## 2021-07-06 DIAGNOSIS — F418 Other specified anxiety disorders: Secondary | ICD-10-CM | POA: Diagnosis not present

## 2021-07-06 DIAGNOSIS — Z6841 Body Mass Index (BMI) 40.0 and over, adult: Secondary | ICD-10-CM | POA: Diagnosis not present

## 2021-07-06 DIAGNOSIS — Z880 Allergy status to penicillin: Secondary | ICD-10-CM | POA: Diagnosis not present

## 2021-07-06 DIAGNOSIS — Z803 Family history of malignant neoplasm of breast: Secondary | ICD-10-CM | POA: Diagnosis not present

## 2021-07-06 HISTORY — PX: UPPER GI ENDOSCOPY: SHX6162

## 2021-07-06 LAB — PREGNANCY, URINE: Preg Test, Ur: NEGATIVE

## 2021-07-06 LAB — CBC WITH DIFFERENTIAL/PLATELET
Abs Immature Granulocytes: 0.02 10*3/uL (ref 0.00–0.07)
Basophils Absolute: 0 10*3/uL (ref 0.0–0.1)
Basophils Relative: 1 %
Eosinophils Absolute: 0.1 10*3/uL (ref 0.0–0.5)
Eosinophils Relative: 1 %
HCT: 43.3 % (ref 36.0–46.0)
Hemoglobin: 13.9 g/dL (ref 12.0–15.0)
Immature Granulocytes: 0 %
Lymphocytes Relative: 18 %
Lymphs Abs: 1.2 10*3/uL (ref 0.7–4.0)
MCH: 28.5 pg (ref 26.0–34.0)
MCHC: 32.1 g/dL (ref 30.0–36.0)
MCV: 88.9 fL (ref 80.0–100.0)
Monocytes Absolute: 0.5 10*3/uL (ref 0.1–1.0)
Monocytes Relative: 8 %
Neutro Abs: 4.7 10*3/uL (ref 1.7–7.7)
Neutrophils Relative %: 72 %
Platelets: 258 10*3/uL (ref 150–400)
RBC: 4.87 MIL/uL (ref 3.87–5.11)
RDW: 13.2 % (ref 11.5–15.5)
WBC: 6.6 10*3/uL (ref 4.0–10.5)
nRBC: 0 % (ref 0.0–0.2)

## 2021-07-06 LAB — COMPREHENSIVE METABOLIC PANEL
ALT: 27 U/L (ref 0–44)
AST: 28 U/L (ref 15–41)
Albumin: 4.2 g/dL (ref 3.5–5.0)
Alkaline Phosphatase: 74 U/L (ref 38–126)
Anion gap: 8 (ref 5–15)
BUN: 15 mg/dL (ref 6–20)
CO2: 23 mmol/L (ref 22–32)
Calcium: 8.9 mg/dL (ref 8.9–10.3)
Chloride: 104 mmol/L (ref 98–111)
Creatinine, Ser: 0.75 mg/dL (ref 0.44–1.00)
GFR, Estimated: >60 mL/min (ref >60)
Glucose, Bld: 92 mg/dL (ref 70–99)
Potassium: 3.7 mmol/L (ref 3.5–5.1)
Sodium: 135 mmol/L (ref 135–145)
Total Bilirubin: 0.7 mg/dL (ref 0.3–1.2)
Total Protein: 7.6 g/dL (ref 6.5–8.1)

## 2021-07-06 LAB — HEMOGLOBIN AND HEMATOCRIT, BLOOD
HCT: 41.7 % (ref 36.0–46.0)
Hemoglobin: 13.6 g/dL (ref 12.0–15.0)

## 2021-07-06 LAB — TYPE AND SCREEN
ABO/RH(D): A POS
Antibody Screen: NEGATIVE

## 2021-07-06 SURGERY — XI ROBOTIC GASTRIC SLEEVE RESECTION
Anesthesia: General | Site: Abdomen

## 2021-07-06 MED ORDER — SODIUM CHLORIDE (PF) 0.9 % IJ SOLN
INTRAMUSCULAR | Status: DC | PRN
  Administered 2021-07-06: 10 mL

## 2021-07-06 MED ORDER — PROPOFOL 1000 MG/100ML IV EMUL
INTRAVENOUS | Status: AC
  Filled 2021-07-06: qty 100

## 2021-07-06 MED ORDER — MIDAZOLAM HCL 2 MG/2ML IJ SOLN
INTRAMUSCULAR | Status: DC | PRN
  Administered 2021-07-06: 2 mg via INTRAVENOUS

## 2021-07-06 MED ORDER — PROPOFOL 500 MG/50ML IV EMUL
INTRAVENOUS | Status: DC | PRN
  Administered 2021-07-06: 160 ug/kg/min via INTRAVENOUS
  Administered 2021-07-06: 125 ug/kg/min via INTRAVENOUS

## 2021-07-06 MED ORDER — PROPOFOL 500 MG/50ML IV EMUL: INTRAVENOUS | Status: DC | PRN

## 2021-07-06 MED ORDER — CHLORHEXIDINE GLUCONATE CLOTH 2 % EX PADS: 6.0000 | MEDICATED_PAD | Freq: Once | CUTANEOUS | Status: DC

## 2021-07-06 MED ORDER — FENTANYL CITRATE (PF) 250 MCG/5ML IJ SOLN
INTRAMUSCULAR | Status: AC
  Filled 2021-07-06: qty 5

## 2021-07-06 MED ORDER — LIDOCAINE 2% (20 MG/ML) 5 ML SYRINGE: INTRAMUSCULAR | Status: DC | PRN

## 2021-07-06 MED ORDER — SUGAMMADEX SODIUM 500 MG/5ML IV SOLN
INTRAVENOUS | Status: AC
  Filled 2021-07-06: qty 5

## 2021-07-06 MED ORDER — KCL IN DEXTROSE-NACL 20-5-0.45 MEQ/L-%-% IV SOLN
INTRAVENOUS | Status: DC
  Filled 2021-07-06: qty 1000

## 2021-07-06 MED ORDER — APREPITANT 40 MG PO CAPS
40.0000 mg | ORAL_CAPSULE | ORAL | Status: AC
  Administered 2021-07-06: 40 mg via ORAL
  Filled 2021-07-06: qty 1

## 2021-07-06 MED ORDER — HEPARIN SODIUM (PORCINE) 5000 UNIT/ML IJ SOLN
5000.0000 [IU] | INTRAMUSCULAR | Status: AC
  Administered 2021-07-06: 5000 [IU] via SUBCUTANEOUS
  Filled 2021-07-06: qty 1

## 2021-07-06 MED ORDER — HEPARIN SODIUM (PORCINE) 5000 UNIT/ML IJ SOLN
5000.0000 [IU] | Freq: Three times a day (TID) | INTRAMUSCULAR | Status: DC
  Administered 2021-07-06 – 2021-07-07 (×3): 5000 [IU] via SUBCUTANEOUS
  Filled 2021-07-06 (×3): qty 1

## 2021-07-06 MED ORDER — CHLORHEXIDINE GLUCONATE 0.12 % MT SOLN
15.0000 mL | Freq: Once | OROMUCOSAL | Status: AC
  Administered 2021-07-06: 15 mL via OROMUCOSAL

## 2021-07-06 MED ORDER — ROCURONIUM BROMIDE 10 MG/ML (PF) SYRINGE
PREFILLED_SYRINGE | INTRAVENOUS | Status: AC
  Filled 2021-07-06: qty 10

## 2021-07-06 MED ORDER — LIDOCAINE 2% (20 MG/ML) 5 ML SYRINGE
INTRAMUSCULAR | Status: DC | PRN
  Administered 2021-07-06: 1.5 mg/kg/h via INTRAVENOUS

## 2021-07-06 MED ORDER — PROPOFOL 500 MG/50ML IV EMUL
INTRAVENOUS | Status: AC
  Filled 2021-07-06: qty 50

## 2021-07-06 MED ORDER — ROCURONIUM BROMIDE 10 MG/ML (PF) SYRINGE
PREFILLED_SYRINGE | INTRAVENOUS | Status: DC | PRN
  Administered 2021-07-06: 20 mg via INTRAVENOUS
  Administered 2021-07-06: 100 mg via INTRAVENOUS
  Administered 2021-07-06: 10 mg via INTRAVENOUS

## 2021-07-06 MED ORDER — ONDANSETRON HCL 4 MG/2ML IJ SOLN
INTRAMUSCULAR | Status: AC
  Filled 2021-07-06: qty 2

## 2021-07-06 MED ORDER — LIDOCAINE HCL (CARDIAC) PF 100 MG/5ML IV SOSY
PREFILLED_SYRINGE | INTRAVENOUS | Status: DC | PRN
  Administered 2021-07-06: 100 mg via INTRATRACHEAL

## 2021-07-06 MED ORDER — MORPHINE SULFATE (PF) 2 MG/ML IV SOLN: 1.0000 mg | INTRAVENOUS | Status: DC | PRN

## 2021-07-06 MED ORDER — 0.9 % SODIUM CHLORIDE (POUR BTL) OPTIME
TOPICAL | Status: DC | PRN
  Administered 2021-07-06: 1000 mL

## 2021-07-06 MED ORDER — FENTANYL CITRATE PF 50 MCG/ML IJ SOSY
PREFILLED_SYRINGE | INTRAMUSCULAR | Status: AC
  Filled 2021-07-06: qty 1

## 2021-07-06 MED ORDER — BUPIVACAINE LIPOSOME 1.3 % IJ SUSP
INTRAMUSCULAR | Status: DC | PRN
  Administered 2021-07-06: 20 mL

## 2021-07-06 MED ORDER — ONDANSETRON HCL 4 MG/2ML IJ SOLN
4.0000 mg | INTRAMUSCULAR | Status: DC | PRN
  Filled 2021-07-06: qty 2

## 2021-07-06 MED ORDER — STERILE WATER FOR IRRIGATION IR SOLN
Status: DC | PRN
  Administered 2021-07-06: 1000 mL

## 2021-07-06 MED ORDER — FENTANYL CITRATE (PF) 250 MCG/5ML IJ SOLN
INTRAMUSCULAR | Status: DC | PRN
  Administered 2021-07-06 (×2): 50 ug via INTRAVENOUS
  Administered 2021-07-06: 100 ug via INTRAVENOUS
  Administered 2021-07-06: 50 ug via INTRAVENOUS

## 2021-07-06 MED ORDER — ORAL CARE MOUTH RINSE: 15.0000 mL | Freq: Once | OROMUCOSAL | Status: AC

## 2021-07-06 MED ORDER — LIDOCAINE HCL (PF) 2 % IJ SOLN
INTRAMUSCULAR | Status: AC
  Filled 2021-07-06: qty 5

## 2021-07-06 MED ORDER — PROPOFOL 10 MG/ML IV BOLUS
INTRAVENOUS | Status: DC | PRN
  Administered 2021-07-06: 200 mg via INTRAVENOUS

## 2021-07-06 MED ORDER — PANTOPRAZOLE SODIUM 40 MG IV SOLR
40.0000 mg | Freq: Every day | INTRAVENOUS | Status: DC
  Administered 2021-07-06: 40 mg via INTRAVENOUS
  Filled 2021-07-06: qty 40

## 2021-07-06 MED ORDER — PHENYLEPHRINE 40 MCG/ML (10ML) SYRINGE FOR IV PUSH (FOR BLOOD PRESSURE SUPPORT)
PREFILLED_SYRINGE | INTRAVENOUS | Status: AC
  Filled 2021-07-06: qty 10

## 2021-07-06 MED ORDER — LACTATED RINGERS IR SOLN
Status: DC | PRN
  Administered 2021-07-06: 1000 mL

## 2021-07-06 MED ORDER — PROPOFOL 10 MG/ML IV BOLUS
INTRAVENOUS | Status: AC
  Filled 2021-07-06: qty 20

## 2021-07-06 MED ORDER — SUGAMMADEX SODIUM 500 MG/5ML IV SOLN
INTRAVENOUS | Status: DC | PRN
  Administered 2021-07-06: 300 mg via INTRAVENOUS

## 2021-07-06 MED ORDER — OXYCODONE HCL 5 MG/5ML PO SOLN
5.0000 mg | Freq: Four times a day (QID) | ORAL | Status: DC | PRN
  Administered 2021-07-06 – 2021-07-07 (×4): 5 mg via ORAL
  Filled 2021-07-06 (×4): qty 5

## 2021-07-06 MED ORDER — ACETAMINOPHEN 500 MG PO TABS
1000.0000 mg | ORAL_TABLET | Freq: Three times a day (TID) | ORAL | Status: DC
  Administered 2021-07-06 – 2021-07-07 (×3): 1000 mg via ORAL
  Filled 2021-07-06 (×4): qty 2

## 2021-07-06 MED ORDER — BUPIVACAINE LIPOSOME 1.3 % IJ SUSP: 20.0000 mL | Freq: Once | INTRAMUSCULAR | Status: DC

## 2021-07-06 MED ORDER — PROPOFOL 1000 MG/100ML IV EMUL
INTRAVENOUS | Status: AC
  Filled 2021-07-06: qty 200

## 2021-07-06 MED ORDER — ACETAMINOPHEN 500 MG PO TABS
1000.0000 mg | ORAL_TABLET | ORAL | Status: AC
  Administered 2021-07-06: 1000 mg via ORAL
  Filled 2021-07-06: qty 2

## 2021-07-06 MED ORDER — PHENYLEPHRINE 40 MCG/ML (10ML) SYRINGE FOR IV PUSH (FOR BLOOD PRESSURE SUPPORT)
PREFILLED_SYRINGE | INTRAVENOUS | Status: DC | PRN
  Administered 2021-07-06: 120 ug via INTRAVENOUS

## 2021-07-06 MED ORDER — DEXAMETHASONE SODIUM PHOSPHATE 10 MG/ML IJ SOLN
INTRAMUSCULAR | Status: AC
  Filled 2021-07-06: qty 1

## 2021-07-06 MED ORDER — DEXAMETHASONE SODIUM PHOSPHATE 10 MG/ML IJ SOLN
INTRAMUSCULAR | Status: DC | PRN
  Administered 2021-07-06: 10 mg via INTRAVENOUS

## 2021-07-06 MED ORDER — ACETAMINOPHEN 160 MG/5ML PO SOLN: 1000.0000 mg | Freq: Three times a day (TID) | ORAL | Status: DC

## 2021-07-06 MED ORDER — MIDAZOLAM HCL 2 MG/2ML IJ SOLN
INTRAMUSCULAR | Status: AC
  Filled 2021-07-06: qty 2

## 2021-07-06 MED ORDER — LACTATED RINGERS IV SOLN: INTRAVENOUS | Status: DC

## 2021-07-06 MED ORDER — FENTANYL CITRATE PF 50 MCG/ML IJ SOSY
PREFILLED_SYRINGE | INTRAMUSCULAR | Status: AC
  Administered 2021-07-06: 50 ug via INTRAVENOUS
  Filled 2021-07-06: qty 2

## 2021-07-06 MED ORDER — SCOPOLAMINE 1 MG/3DAYS TD PT72
1.0000 | MEDICATED_PATCH | TRANSDERMAL | Status: DC
  Administered 2021-07-06: 1.5 mg via TRANSDERMAL
  Filled 2021-07-06: qty 1

## 2021-07-06 MED ORDER — FENTANYL CITRATE PF 50 MCG/ML IJ SOSY
25.0000 ug | PREFILLED_SYRINGE | INTRAMUSCULAR | Status: DC | PRN
  Administered 2021-07-06 (×2): 50 ug via INTRAVENOUS

## 2021-07-06 MED ORDER — LEVOFLOXACIN IN D5W 750 MG/150ML IV SOLN
750.0000 mg | INTRAVENOUS | Status: AC
Start: 2021-07-06 — End: 2021-07-06
  Administered 2021-07-06: 750 mg via INTRAVENOUS
  Filled 2021-07-06: qty 150

## 2021-07-06 MED ORDER — ONDANSETRON HCL 4 MG/2ML IJ SOLN
INTRAMUSCULAR | Status: DC | PRN
  Administered 2021-07-06: 4 mg via INTRAVENOUS

## 2021-07-06 MED ORDER — ENSURE MAX PROTEIN PO LIQD
2.0000 [oz_av] | ORAL | Status: DC
  Administered 2021-07-07 (×4): 2 [oz_av] via ORAL

## 2021-07-06 MED ORDER — LIDOCAINE HCL (PF) 2 % IJ SOLN
INTRAMUSCULAR | Status: AC
  Filled 2021-07-06: qty 15

## 2021-07-06 SURGICAL SUPPLY — 64 items
APPLIER CLIP 5 13 M/L LIGAMAX5 (MISCELLANEOUS)
APPLIER CLIP ROT 10 11.4 M/L (STAPLE)
BLADE SURG 15 STRL LF DISP TIS (BLADE) ×2 IMPLANT
BLADE SURG 15 STRL SS (BLADE) ×1
CANNULA REDUC XI 12-8 STAPL (CANNULA) ×1
CANNULA REDUCER 12-8 DVNC XI (CANNULA) ×2 IMPLANT
CHLORAPREP W/TINT 26 (MISCELLANEOUS) ×3 IMPLANT
CLIP APPLIE 5 13 M/L LIGAMAX5 (MISCELLANEOUS) IMPLANT
CLIP APPLIE ROT 10 11.4 M/L (STAPLE) IMPLANT
COVER SURGICAL LIGHT HANDLE (MISCELLANEOUS) ×3 IMPLANT
DECANTER SPIKE VIAL GLASS SM (MISCELLANEOUS) ×3 IMPLANT
DERMABOND ADVANCED (GAUZE/BANDAGES/DRESSINGS) ×1
DERMABOND ADVANCED .7 DNX12 (GAUZE/BANDAGES/DRESSINGS) ×2 IMPLANT
DRAPE ARM DVNC X/XI (DISPOSABLE) ×8 IMPLANT
DRAPE COLUMN DVNC XI (DISPOSABLE) ×2 IMPLANT
DRAPE DA VINCI XI ARM (DISPOSABLE) ×4
DRAPE DA VINCI XI COLUMN (DISPOSABLE) ×1
ELECT REM PT RETURN 15FT ADLT (MISCELLANEOUS) ×3 IMPLANT
GLOVE SURG ENC MOIS LTX SZ8 (GLOVE) ×6 IMPLANT
GOWN STRL REUS W/TWL XL LVL3 (GOWN DISPOSABLE) ×9 IMPLANT
GRASPER SUT TROCAR 14GX15 (MISCELLANEOUS) ×3 IMPLANT
IRRIG SUCT STRYKERFLOW 2 WTIP (MISCELLANEOUS) ×3
IRRIGATION SUCT STRKRFLW 2 WTP (MISCELLANEOUS) ×2 IMPLANT
KIT BASIN OR (CUSTOM PROCEDURE TRAY) ×3 IMPLANT
KIT TURNOVER KIT A (KITS) ×3 IMPLANT
LUBRICANT JELLY K Y 4OZ (MISCELLANEOUS) IMPLANT
MARKER SKIN DUAL TIP RULER LAB (MISCELLANEOUS) ×3 IMPLANT
MAT PREVALON FULL STRYKER (MISCELLANEOUS) ×3 IMPLANT
NEEDLE SPNL 22GX3.5 QUINCKE BK (NEEDLE) ×3 IMPLANT
OBTURATOR OPTICAL STANDARD 8MM (TROCAR) ×1
OBTURATOR OPTICAL STND 8 DVNC (TROCAR) ×2
OBTURATOR OPTICALSTD 8 DVNC (TROCAR) ×2 IMPLANT
PACK CARDIOVASCULAR III (CUSTOM PROCEDURE TRAY) ×3 IMPLANT
RELOAD STAPLER 2.5X60 WHT DVNC (STAPLE) ×10 IMPLANT
RELOAD STAPLER 3.5X60 BLU DVNC (STAPLE) ×2 IMPLANT
SCISSORS LAP 5X35 DISP (ENDOMECHANICALS) IMPLANT
SEAL CANN UNIV 5-8 DVNC XI (MISCELLANEOUS) ×6 IMPLANT
SEAL XI 5MM-8MM UNIVERSAL (MISCELLANEOUS) ×3
SEALER VESSEL DA VINCI XI (MISCELLANEOUS) ×1
SEALER VESSEL EXT DVNC XI (MISCELLANEOUS) ×2 IMPLANT
SLEEVE GASTRECTOMY 36FR VISIGI (MISCELLANEOUS) ×3 IMPLANT
SOL ANTI FOG 6CC (MISCELLANEOUS) ×2 IMPLANT
SOLUTION ANTI FOG 6CC (MISCELLANEOUS) ×1
SOLUTION ELECTROLUBE (MISCELLANEOUS) ×3 IMPLANT
SPONGE T-LAP 18X18 ~~LOC~~+RFID (SPONGE) ×3 IMPLANT
STAPLER 60 DA VINCI SURE FORM (STAPLE) ×1
STAPLER 60 SUREFORM DVNC (STAPLE) ×2 IMPLANT
STAPLER CANNULA SEAL DVNC XI (STAPLE) ×2 IMPLANT
STAPLER CANNULA SEAL XI (STAPLE) ×1
STAPLER RELOAD 2.5X60 WHITE (STAPLE) ×5
STAPLER RELOAD 2.5X60 WHT DVNC (STAPLE) ×10
STAPLER RELOAD 3.5X60 BLU DVNC (STAPLE) ×2
STAPLER RELOAD 3.5X60 BLUE (STAPLE) ×1
SUT ETHIBOND 0 36 GRN (SUTURE) IMPLANT
SUT MNCRL AB 4-0 PS2 18 (SUTURE) ×6 IMPLANT
SUT VICRYL 0 TIES 12 18 (SUTURE) ×3 IMPLANT
SYR 10ML ECCENTRIC (SYRINGE) IMPLANT
SYR 20ML LL LF (SYRINGE) ×3 IMPLANT
TOWEL OR 17X26 10 PK STRL BLUE (TOWEL DISPOSABLE) ×3 IMPLANT
TRAY FOLEY MTR SLVR 16FR STAT (SET/KITS/TRAYS/PACK) IMPLANT
TROCAR ADV FIXATION 5X100MM (TROCAR) IMPLANT
TROCAR BLADELESS OPT 5 100 (ENDOMECHANICALS) ×3 IMPLANT
TUBE CALIBRATION LAPBAND (TUBING) IMPLANT
TUBING INSUFFLATION 10FT LAP (TUBING) ×3 IMPLANT

## 2021-07-06 NOTE — Op Note (Signed)
   Surgeon: Kaylyn Lim, MD, FACS  Asst:  Louanna Raw, MD 06 July 2021 Anes:  General endotracheal  Procedure: Robotic sleeve gastrectomy and upper endoscopy  Diagnosis: Morbid obesity  Complications: None noted  EBL:   minimal cc  Description of Procedure:  The patient was take to OR 2 and given general anesthesia.  The abdomen was prepped with Chloroprep and draped sterilely.  A timeout was performed.  Access to the abdomen was achieved with a 5 mm Optiview on the left side.  Following insufflation, the state of the abdomen was found to be free of adhesions.  The ViSiGi 36Fr tube was inserted to deflate the stomach and was pulled back into the esophagus.  Four trocars were placed including a 12 mm for the robotic stapler.    The pylorus was identified and we measured 6 cm back and marked the antrum.  At that point we began dissection to take down the greater curvature of the stomach using the vessel sealer.  This dissection was taken all the way up to the left crus.  Posterior attachments of the stomach were also taken down.    The ViSiGi tube was then passed into the antrum and suction applied so that it was snug along the lessor curvature.  The "crow's foot" or incisura was identified.  The sleeve gastrectomy was begun using the Sureform platform stapler beginning with a blue load followed by white loads.  When the sleeve was complete the tube was taken off suction and insufflated briefly.  The tube was withdrawn.  Upper endoscopy was then performed by Dr. Thermon Leyland.  There was a bleeder at the lowest point of the sleeve that was oversewn with a 2-0 vicryl.     The specimen was extracted through the 12 trocar site which was closed with a 0 vicryl and the PMI.   Local block was provided by infiltrating abdomen as a TAP block of Exparel and then closed 4-0 Monocryl and Dermabond.    Matt B. Hassell Done, [place], [facility], [place]

## 2021-07-06 NOTE — Progress Notes (Signed)

## 2021-07-06 NOTE — Anesthesia Procedure Notes (Signed)
Procedure Name: Intubation Date/Time: 07/06/2021 11:24 AM Performed by: Sharlette Dense, CRNA Pre-anesthesia Checklist: Patient identified, Emergency Drugs available, Suction available and Patient being monitored Patient Re-evaluated:Patient Re-evaluated prior to induction Oxygen Delivery Method: Circle system utilized Preoxygenation: Pre-oxygenation with 100% oxygen Induction Type: IV induction Ventilation: Mask ventilation without difficulty and Oral airway inserted - appropriate to patient size Laryngoscope Size: Sabra Heck and 2 Grade View: Grade I Tube type: Oral Tube size: 7.5 mm Number of attempts: 1 Airway Equipment and Method: Stylet Placement Confirmation: ETT inserted through vocal cords under direct vision, positive ETCO2 and breath sounds checked- equal and bilateral Secured at: 22 cm Tube secured with: Tape Dental Injury: Teeth and Oropharynx as per pre-operative assessment

## 2021-07-06 NOTE — Progress Notes (Signed)
PHARMACY CONSULT FOR: Risk Assessment for Post-Discharge VTE Following Bariatric Surgery  Post-Discharge VTE Risk Assessment: This patient's probability of 30-day post-discharge VTE is increased due to the factors marked:   Female    Age >/=60 years    BMI >/=50 kg/m2    CHF    Dyspnea at Rest    Paraplegia  X  Non-gastric-band surgery    Operation Time >/=3 hr    Return to OR     Length of Stay >/= 3 d   Hx of VTE   Hypercoagulable condition   Significant venous stasis    Predicted probability of 30-day post-discharge VTE: 0.16%  Other patient-specific factors to consider: N/A  Recommendation for Discharge: No pharmacologic prophylaxis post-discharge  Lauren Macias is a 35 y.o. female who underwent robotic sleeve gastrectomy and upper endoscopy on 07/06/2021.  Allergies  Allergen Reactions   Penicillins Anaphylaxis    Patient Measurements: Height: 5\' 11"  (180.3 cm) Weight: (!) 156.3 kg (344 lb 9.6 oz) IBW/kg (Calculated) : 70.8 Body mass index is 48.06 kg/m.  Recent Labs    07/06/21 0930  WBC 6.6  HGB 13.9  HCT 43.3  PLT 258  CREATININE 0.75  ALBUMIN 4.2  PROT 7.6  AST 28  ALT 27  ALKPHOS 74  BILITOT 0.7   Estimated Creatinine Clearance: 165.8 mL/min (by C-G formula based on SCr of 0.75 mg/dL).  Past Medical History:  Diagnosis Date   Anxiety    Arthritis    lt hand   Complication of anesthesia    Depression    h/o suicide attempt as teenager, she feels it was caused by lithium   Endometriosis    History of kidney stones    age 61   PONV (postoperative nausea and vomiting)     Medications Prior to Admission  Medication Sig Dispense Refill Last Dose   ibuprofen (ADVIL) 200 MG tablet Take 800 mg by mouth every 8 (eight) hours as needed (pain).   Past Month   lisdexamfetamine (VYVANSE) 40 MG capsule 1 capsule by mouth daily 30 capsule 0 07/05/2021   Multiple Vitamins-Minerals (BARIATRIC MULTIVITAMINS/IRON) CAPS Take 1 tablet by mouth  daily.   07/05/2021   buPROPion (WELLBUTRIN XL) 150 MG 24 hr tablet TAKE 1 TABLET BY MOUTH ONCE DAILY (Patient not taking: No sig reported) 90 tablet 3 Not Taking   diphenhydrAMINE (BENADRYL) 25 MG tablet Take 50 mg by mouth daily as needed for allergies.   More than a month   Probiotic Product (PROBIOTIC PEARLS WOMENS PO) Take 1 capsule by mouth in the morning.       Lauren Macias A Lauren Macias 07/06/2021,2:14 PM

## 2021-07-06 NOTE — Anesthesia Preprocedure Evaluation (Addendum)
Anesthesia Evaluation  Patient identified by MRN, date of birth, ID band Patient awake    Reviewed: Allergy & Precautions, NPO status , Patient's Chart, lab work & pertinent test results  History of Anesthesia Complications (+) PONV  Airway Mallampati: I  TM Distance: >3 FB Neck ROM: Full    Dental  (+) Teeth Intact, Dental Advisory Given Permanent retainer on bottom teeth:   Pulmonary neg pulmonary ROS,    Pulmonary exam normal breath sounds clear to auscultation       Cardiovascular negative cardio ROS Normal cardiovascular exam Rhythm:Regular Rate:Normal     Neuro/Psych PSYCHIATRIC DISORDERS Anxiety Depression negative neurological ROS     GI/Hepatic negative GI ROS, Neg liver ROS,   Endo/Other  Morbid obesity (BMI 48)  Renal/GU negative Renal ROS  negative genitourinary   Musculoskeletal  (+) Arthritis ,   Abdominal   Peds  Hematology negative hematology ROS (+)   Anesthesia Other Findings   Reproductive/Obstetrics                            Anesthesia Physical Anesthesia Plan  ASA: 3  Anesthesia Plan: General   Post-op Pain Management:    Induction: Intravenous  PONV Risk Score and Plan: 4 or greater and Midazolam, Dexamethasone, Ondansetron, Scopolamine patch - Pre-op and Aprepitant  Airway Management Planned: Oral ETT  Additional Equipment:   Intra-op Plan:   Post-operative Plan: Extubation in OR  Informed Consent: I have reviewed the patients History and Physical, chart, labs and discussed the procedure including the risks, benefits and alternatives for the proposed anesthesia with the patient or authorized representative who has indicated his/her understanding and acceptance.     Dental advisory given  Plan Discussed with: CRNA  Anesthesia Plan Comments:        Anesthesia Quick Evaluation

## 2021-07-06 NOTE — Transfer of Care (Signed)
Immediate Anesthesia Transfer of Care Note  Patient: Lauren Macias  Procedure(s) Performed: XI ROBOTIC GASTRIC SLEEVE RESECTION (Abdomen) UPPER GI ENDOSCOPY  Patient Location: PACU  Anesthesia Type:General  Level of Consciousness: drowsy  Airway & Oxygen Therapy: Patient Spontanous Breathing and Patient connected to face mask oxygen  Post-op Assessment: Report given to RN and Post -op Vital signs reviewed and stable  Post vital signs: Reviewed and stable  Last Vitals:  Vitals Value Taken Time  BP 142/83 07/06/21 1333  Temp    Pulse 79 07/06/21 1335  Resp 18 07/06/21 1335  SpO2 100 % 07/06/21 1335  Vitals shown include unvalidated device data.  Last Pain:  Vitals:   07/06/21 0926  TempSrc:   PainSc: 0-No pain         Complications: No notable events documented.

## 2021-07-07 ENCOUNTER — Encounter (HOSPITAL_COMMUNITY): Payer: Self-pay | Admitting: Surgery

## 2021-07-07 ENCOUNTER — Other Ambulatory Visit (HOSPITAL_COMMUNITY): Payer: Self-pay

## 2021-07-07 LAB — CBC WITH DIFFERENTIAL/PLATELET
Abs Immature Granulocytes: 0.06 10*3/uL (ref 0.00–0.07)
Basophils Absolute: 0 10*3/uL (ref 0.0–0.1)
Basophils Relative: 0 %
Eosinophils Absolute: 0 10*3/uL (ref 0.0–0.5)
Eosinophils Relative: 0 %
HCT: 41.1 % (ref 36.0–46.0)
Hemoglobin: 13.1 g/dL (ref 12.0–15.0)
Immature Granulocytes: 1 %
Lymphocytes Relative: 6 %
Lymphs Abs: 0.6 10*3/uL — ABNORMAL LOW (ref 0.7–4.0)
MCH: 28.4 pg (ref 26.0–34.0)
MCHC: 31.9 g/dL (ref 30.0–36.0)
MCV: 89.2 fL (ref 80.0–100.0)
Monocytes Absolute: 0.4 10*3/uL (ref 0.1–1.0)
Monocytes Relative: 4 %
Neutro Abs: 9.3 10*3/uL — ABNORMAL HIGH (ref 1.7–7.7)
Neutrophils Relative %: 89 %
Platelets: 283 10*3/uL (ref 150–400)
RBC: 4.61 MIL/uL (ref 3.87–5.11)
RDW: 13.2 % (ref 11.5–15.5)
WBC: 10.4 10*3/uL (ref 4.0–10.5)
nRBC: 0 % (ref 0.0–0.2)

## 2021-07-07 MED ORDER — ONDANSETRON 4 MG PO TBDP
4.0000 mg | ORAL_TABLET | Freq: Four times a day (QID) | ORAL | 0 refills | Status: DC | PRN
  Filled 2021-07-07: qty 20, 5d supply, fill #0

## 2021-07-07 MED ORDER — OXYCODONE HCL 5 MG PO TABS
5.0000 mg | ORAL_TABLET | Freq: Four times a day (QID) | ORAL | 0 refills | Status: DC | PRN
  Filled 2021-07-07: qty 10, 3d supply, fill #0

## 2021-07-07 MED ORDER — PANTOPRAZOLE SODIUM 40 MG PO TBEC
40.0000 mg | DELAYED_RELEASE_TABLET | Freq: Every day | ORAL | 0 refills | Status: DC
  Filled 2021-07-07: qty 90, 90d supply, fill #0

## 2021-07-07 NOTE — Anesthesia Postprocedure Evaluation (Signed)
Anesthesia Post Note  Patient: Lauren Macias  Procedure(s) Performed: XI ROBOTIC GASTRIC SLEEVE RESECTION (Abdomen) UPPER GI ENDOSCOPY     Patient location during evaluation: PACU Anesthesia Type: General Level of consciousness: awake and alert Pain management: pain level controlled Vital Signs Assessment: post-procedure vital signs reviewed and stable Respiratory status: spontaneous breathing, nonlabored ventilation, respiratory function stable and patient connected to nasal cannula oxygen Cardiovascular status: blood pressure returned to baseline and stable Postop Assessment: no apparent nausea or vomiting Anesthetic complications: no   No notable events documented.  Last Vitals:  Vitals:   07/07/21 0153 07/07/21 0530  BP: 130/78 126/68  Pulse: 63 87  Resp: 18 16  Temp: 37 C 37 C  SpO2: 97% 98%    Last Pain:  Vitals:   07/07/21 0607  TempSrc:   PainSc: Asleep                 Agnes Brightbill L Evana Runnels

## 2021-07-07 NOTE — Progress Notes (Signed)
Patient alert and oriented, Post op day 1.  Provided support and encouragement.  Encouraged pulmonary toilet, ambulation and small sips of liquids.  All questions answered.  Will continue to monitor. 

## 2021-07-07 NOTE — Progress Notes (Signed)
24hr fluid recall: 826mL.  Per dehydration protocol, will call pt to f/u within one week post op.

## 2021-07-07 NOTE — Progress Notes (Signed)
Reviewed written d/c instructions w pt and all questions answered. She verbalized understanding. D/C per w/c w all belongings in stable condition. 

## 2021-07-07 NOTE — Discharge Summary (Signed)
Physician Discharge Summary  Patient ID: Lauren Macias MRN: [id number] DOB/AGE: Jun 13, 1986 35 y.o.  PCP: Remi Haggard, FNP  Admit date: 07/06/2021 Discharge date: 07/07/2021  Admission Diagnoses:  morbid obesity  Discharge Diagnoses:  same  Active Problems:   S/P laparoscopic sleeve gastrectomy   Surgery:  robotic XI sleeve gastrectomy   Discharged Condition: improved  Hospital Course:   had surgery on Tuesday and did well with liquids.  Ready for discharge on Wednesday  Consults: none  Significant Diagnostic Studies: none    Discharge Exam: Blood pressure 126/68, pulse 87, temperature 98.6 F (37 C), temperature source Oral, resp. rate 16, height 5\' 11"  (1.803 m), weight (!) 156.3 kg, last menstrual period 06/07/2021, SpO2 98 %. Incisions bland  Disposition: Discharge disposition: 01-Home or Self Care       Discharge Instructions     Ambulate hourly while awake   Complete by: As directed    Call MD for:  difficulty breathing, headache or visual disturbances   Complete by: As directed    Call MD for:  persistant dizziness or light-headedness   Complete by: As directed    Call MD for:  persistant nausea and vomiting   Complete by: As directed    Call MD for:  redness, tenderness, or signs of infection (pain, swelling, redness, odor or green/yellow discharge around incision site)   Complete by: As directed    Call MD for:  severe uncontrolled pain   Complete by: As directed    Call MD for:  temperature >101 F   Complete by: As directed    Diet bariatric full liquid   Complete by: As directed    Incentive spirometry   Complete by: As directed    Perform hourly while awake      Allergies as of 07/07/2021       Reactions   Penicillins Anaphylaxis        Medication List     STOP taking these medications    ibuprofen 200 MG tablet Commonly known as: ADVIL   PROBIOTIC PEARLS WOMENS PO       TAKE these medications    Bariatric  Multivitamins/Iron Caps Take 1 tablet by mouth daily.   buPROPion 150 MG 24 hr tablet Commonly known as: WELLBUTRIN XL TAKE 1 TABLET BY MOUTH ONCE DAILY   diphenhydrAMINE 25 MG tablet Commonly known as: BENADRYL Take 50 mg by mouth daily as needed for allergies.   ondansetron 4 MG disintegrating tablet Commonly known as: ZOFRAN-ODT Take 1 tablet (4 mg total) by mouth every 6 (six) hours as needed for nausea or vomiting.   oxyCODONE 5 MG immediate release tablet Commonly known as: Oxy IR/ROXICODONE Take 1 tablet (5 mg total) by mouth every 6 (six) hours as needed for severe pain.   pantoprazole 40 MG tablet Commonly known as: PROTONIX Take 1 tablet (40 mg total) by mouth daily.   Vyvanse 40 MG capsule Generic drug: lisdexamfetamine 1 capsule by mouth daily        Follow-up Information     Surgery, [facility] [facility]. Go on 08/04/2021.   Specialty: General Surgery Why: at 9am with Dr. Hassell Done.  Please arrive 15 minutes prior to your appointment time.  Thank you. Contact information: [address] [place] [address] [phone]         Johnathan Hausen, MD. Go on 08/25/2021.   Specialty: General Surgery Why: at 9:15am at the [facility].  Please arrive 15 minutes prior to your appointment  time. Thank you. Contact information: [address] [place] [address] [phone]                 Signed: Pedro Earls 07/07/2021, 12:54 PM

## 2021-07-07 NOTE — Progress Notes (Signed)
Patient alert and oriented, pain is controlled. Patient is tolerating fluids, advanced to protein shake today, patient is tolerating well.  Reviewed Gastric sleeve discharge instructions with patient and patient is able to articulate understanding.  Provided information on BELT program, Support Group and [facility] outpatient pharmacy. All questions answered, will continue to monitor.  

## 2021-07-07 NOTE — Discharge Instructions (Signed)

## 2021-07-08 LAB — SURGICAL PATHOLOGY

## 2021-07-09 ENCOUNTER — Other Ambulatory Visit: Payer: Self-pay | Admitting: *Deleted

## 2021-07-09 ENCOUNTER — Encounter: Payer: Self-pay | Admitting: *Deleted

## 2021-07-09 NOTE — Patient Outreach (Signed)
[facility]) Care Management  07/09/2021  Lauren Macias Jan 18, 1986 [id number]   Transition of care call/case closure   Referral received: 07/06/21 Initial outreach:07/09/21  Insurance: [place] [employer]    Subjective: Initial successful telephone call to patient's preferred number in order to complete transition of care assessment; 2 HIPAA identifiers verified. Explained purpose of call and completed transition of care assessment.  Lauren Macias states that she is doing better on today,denies post-operative problems, says surgical incisions are unremarkable, states surgical pain well managed with prescribed medications. She reports tolerating Bariatric  full liquid diet, her spouse is helping with tracking amounts of intake, protein and liquids she reports getting 30 gm protein in yesterday, continues working on intake sipping about every 2 hours taking zofran prn for nausea. She denies bowel or bladder has had one bowel movement since discharge, denies difficulty or change in urine.  She reports tolerating mobility in the home reinforced continued use of incentive spirometry and  walking in the home .Spouse is assisting with her recovery.   Reviewed accessing the following [place] Benefits : .  She does not have the hospital indemnity, she has made contact with [employer] to file a claim.  She uses a Company secretary outpatient pharmacy at [employer].      Objective:  Lauren Macias was  hospitalized at [facility] 11/1-11/2/22 for  Robotic gastric  sleeve resection Comorbidities include: Obesity, Anxiety  She  was discharged to home on 07/07/21  without the need for home health services or DME.   Assessment:  Patient voices good understanding of all discharge instructions.  See transition of care flowsheet for assessment details.   Plan:  Reviewed hospital discharge diagnosis of Robotic gastric sleeve resection  and discharge treatment plan using  hospital discharge instructions, assessing medication adherence, reviewing problems requiring provider notification, and discussing the importance of follow up with surgeon, primary care provider and/or specialists as directed.  Reviewed  healthy lifestyle program information to earn healthy premium rate for  2024  :  Step 1: Create account at [email or web address] Step 2: Complete your health assessment and annual physical by September 1. 2023.  For questions call [phone]  No ongoing care management needs identified so will close case to [facility] services and route successful outreach letter with [place] Management pamphlet and 24 Hour Nurse Line Magnet to [facility] clinical pool to be mailed to patient's home address.  Thanked patient for their services to [facility].  Joylene Draft, RN, BSN  [place] Management Coordinator  [phone]- Mobile [phone]- Toll Free Main Office

## 2021-07-12 ENCOUNTER — Telehealth (HOSPITAL_COMMUNITY): Payer: Self-pay | Admitting: *Deleted

## 2021-07-12 ENCOUNTER — Other Ambulatory Visit: Payer: Self-pay | Admitting: Student

## 2021-07-12 DIAGNOSIS — R5383 Other fatigue: Secondary | ICD-10-CM

## 2021-07-12 DIAGNOSIS — R110 Nausea: Secondary | ICD-10-CM

## 2021-07-12 NOTE — Telephone Encounter (Signed)
1.  Tell me about your pain and pain management? Pt denies any current pain.  2.  Let's talk about fluid intake.  How much total fluid are you taking in? Pt states that she is working to meet goal of 64 oz of fluid today.  Pt states that she has not been able to meet fluid goal since discharge.  Pt states that she consumed 48oz yesterday. Pt has been able to tolerate protein shakes, soups, broths, Gatorade and water.  Pt plans to drink rest of protein, water, broth for the remainder of the day to meet goal.  Pt encouraged to continue to work towards meeting goal.  Pt instructed to assess status and suggestions daily utilizing Hydration Action Plan on discharge folder and to call [facility] if in the "red zone".   3.  How much protein have you taken in the last 2 days? Pt states that she is working to meet goal of goal of 60g of protein today.  Pt has already consumed one protein shake.  Pt plans to drink remainder of protein throughout the rest of the day to meet goal.  4.  Have you had nausea?  Tell me about when have experienced nausea and what you did to help? Pt denies current nausea, but has needed to take Zofran for intermittent nausea.   5.  Has the frequency or color changed with your urine? Pt states that she is urinating "fine" with no changes in frequency or urgency.     6.  Tell me what your incisions look like? "Incisions look fine". Pt denies a fever, chills.  Pt states incisions are not swollen, open, or draining.  Pt encouraged to call [facility] if incisions change.   7.  Have you been passing gas? BM? Pt states that she is having BMs. Last BM 07/11/21.  Pt states that her BM yesterday was an episode of diarrhea.   8.  If a problem or question were to arise who would you call?  Do you know contact numbers for [place], [facility], and [facility]? Pt c/o feeling light headed, dizzy and sweaty when getting up to walk.  Pt c/o feeling weak and lethargic.  Pt states that she is on her menstrual cycle and bleeds  very heavily due to endometriosis.  Pt can describe s/sx of dehydration.  MD made aware.   Pt knows to call [facility] for surgical, [facility] for nutrition, and [facility] for non-urgent questions or concerns.   9.  How has the walking going? Pt states she is attempting to walk around in spite of symptoms.   10. Are you still using your incentive spirometer?  If so, how often? Pt states that she is using the I.S. some. Pt encouraged to use incentive spirometer, at least 10x every hour while awake until she sees the surgeon.  11.  How are your vitamins and calcium going?  How are you taking them? Pt states that she is taking her supplements and vitamins without difficulty.  Reminded patient that the first 30 days post-operatively are important for successful recovery.  Practice good hand hygiene, wearing a mask when appropriate (since optional in most places), and minimizing exposure to people who live outside of the home, especially if they are exhibiting any respiratory, GI, or illness-like symptoms.

## 2021-07-13 ENCOUNTER — Ambulatory Visit
Admission: RE | Admit: 2021-07-13 | Discharge: 2021-07-13 | Disposition: A | Discharge status: Home / Self Care | Payer: 59 | Source: Ambulatory Visit | Attending: Student | Admitting: Student

## 2021-07-13 DIAGNOSIS — R5383 Other fatigue: Secondary | ICD-10-CM | POA: Insufficient documentation

## 2021-07-13 DIAGNOSIS — R110 Nausea: Secondary | ICD-10-CM | POA: Insufficient documentation

## 2021-07-13 LAB — CBC WITH DIFFERENTIAL/PLATELET
Abs Immature Granulocytes: 0.03 10*3/uL (ref 0.00–0.07)
Basophils Absolute: 0 10*3/uL (ref 0.0–0.1)
Basophils Relative: 0 %
Eosinophils Absolute: 0.1 10*3/uL (ref 0.0–0.5)
Eosinophils Relative: 1 %
HCT: 46 % (ref 36.0–46.0)
Hemoglobin: 14.7 g/dL (ref 12.0–15.0)
Immature Granulocytes: 0 %
Lymphocytes Relative: 17 %
Lymphs Abs: 1.3 10*3/uL (ref 0.7–4.0)
MCH: 27.9 pg (ref 26.0–34.0)
MCHC: 32 g/dL (ref 30.0–36.0)
MCV: 87.5 fL (ref 80.0–100.0)
Monocytes Absolute: 0.4 10*3/uL (ref 0.1–1.0)
Monocytes Relative: 6 %
Neutro Abs: 6.1 10*3/uL (ref 1.7–7.7)
Neutrophils Relative %: 76 %
Platelets: 270 10*3/uL (ref 150–400)
RBC: 5.26 MIL/uL — ABNORMAL HIGH (ref 3.87–5.11)
RDW: 13 % (ref 11.5–15.5)
WBC: 8 10*3/uL (ref 4.0–10.5)
nRBC: 0 % (ref 0.0–0.2)

## 2021-07-13 LAB — BASIC METABOLIC PANEL
Anion gap: 10 (ref 5–15)
BUN: 20 mg/dL (ref 6–20)
CO2: 23 mmol/L (ref 22–32)
Calcium: 9.2 mg/dL (ref 8.9–10.3)
Chloride: 104 mmol/L (ref 98–111)
Creatinine, Ser: 0.87 mg/dL (ref 0.44–1.00)
GFR, Estimated: >60 mL/min (ref >60)
Glucose, Bld: 93 mg/dL (ref 70–99)
Potassium: 3.9 mmol/L (ref 3.5–5.1)
Sodium: 137 mmol/L (ref 135–145)

## 2021-07-13 MED ORDER — SODIUM CHLORIDE FLUSH 0.9 % IV SOLN
INTRAVENOUS | Status: AC
  Filled 2021-07-13: qty 10

## 2021-07-13 MED ORDER — ONDANSETRON HCL 4 MG/2ML IJ SOLN: 4.0000 mg | INTRAMUSCULAR | Status: DC | PRN

## 2021-07-13 MED ORDER — ONDANSETRON 4 MG PO TBDP
4.0000 mg | ORAL_TABLET | ORAL | Status: DC | PRN
  Administered 2021-07-13: 4 mg via ORAL

## 2021-07-13 MED ORDER — SODIUM CHLORIDE 0.9 % IV BOLUS
1000.0000 mL | Freq: Once | INTRAVENOUS | Status: AC
  Administered 2021-07-13: 1000 mL via INTRAVENOUS

## 2021-07-13 MED ORDER — THIAMINE HCL 100 MG/ML IJ SOLN
Freq: Once | INTRAVENOUS | Status: AC
  Filled 2021-07-13: qty 1000

## 2021-07-13 MED ORDER — ONDANSETRON 4 MG PO TBDP
ORAL_TABLET | ORAL | Status: AC
  Filled 2021-07-13: qty 1

## 2021-07-21 ENCOUNTER — Other Ambulatory Visit: Payer: Self-pay

## 2021-07-21 ENCOUNTER — Encounter: Payer: 59 | Attending: Surgery | Admitting: Dietician

## 2021-07-21 ENCOUNTER — Encounter: Payer: Self-pay | Admitting: Dietician

## 2021-07-21 VITALS — Ht 71.0 in | Wt 323.7 lb

## 2021-07-21 DIAGNOSIS — Z6841 Body Mass Index (BMI) 40.0 and over, adult: Secondary | ICD-10-CM | POA: Insufficient documentation

## 2021-07-21 NOTE — Progress Notes (Signed)
Nutrition Therapy for Post-Operative Bariatric Diet Follow-up visit:  2 weeks post-op sleeve gastrectomy Surgery  Medical Nutrition Therapy:  Appt start time: 4944 end time:  9675.  Anthropometrics: Weight: 323.7lbs Height: 5'11"  InBody  BODY COMP RESULTS   05/21/21  07/21/21  BMI (kg/m^2) 49.7 45.2  Skeletal Muscle Mass (lbs) 100.3 89.5  Percent Body Fat 50.7 51.1   Clinical: Medications: lisdexamfetamine, pantoprazole, diphenhydrAMINE Supplementation: Celebrate bariatric multivitamin with 45mg  iron 2x daily; calcium citrate 500mg  3x daily Health/ medical history changes: no changes GI symptoms: N/V/D/C: mild nausea, constipation (taking aid) Dumping Syndrome: no Hair loss: no  Dietary/ Lifestyle Progress: Reports episode of dehydration due to heavy menstrual bleeding soon after surgery, and extended sleep due to pain meds.  Has been using unflavored protein powder in various foods in addition to ready made protein shakes.  Reports metallic taste in mouth when waking up for the past week (started taking MVI at night to avoid nausea)  Dietary recall: Eating pattern: eating every 3-4 hours Dining out: 0 Breakfast: premier protein shake Snack: triple zero [place] yogurt  Lunch: cream soup, broth soup with added protein Snack: 0-carb sugar free pudding, sf jello  Dinner: fruit pebble protein in protein shake Snack: same as pm  Fluid intake: 24oz water, 16-24oz gatorade zero, 11oz protein shake, 1/2c jello, 2-4oz soup = 60-70oz daily Estimated total protein intake: 60-80g daily Bariatric diet adherence:  Using straws: 2x (to prevent cold fluid on teeth), more GI gas Drinking fluids during meals: n/a Carbonated beverages: no  Recent physical activity:  walking 3x in past week + at least 5000 steps at work   Nutrition Intervention:   Reviewed progress since surgery. Commended patient for following diet closely and achieving 30lbs weight loss. Instructed patient on advancing  diet to include solid protein foods.  Discussed possible causes of metallic taste, including MVI Discussed tolerance of foods with post-op healing, and importance of avoiding unhealthy foods to promote permanent weight loss.  Nutritional Diagnosis:  [place]-3.3 Overweight/obesity As related to history of excess calories and inadequate physical activity.  As evidenced by patient with current BMI of 45.2, following bariatric diet for ongoing weight loss after bariatric surgery.  Teaching Method Utilized:  Visual Auditory Hands on  Materials provided: Phase 3 and 4 bariatric diet handouts Visit summary with goals to be viewed via [place]:  Change in progress  Barriers to learning/adherence to lifestyle change: none  Demonstrated degree of understanding via:  Teach Back      Plan: Return for follow up [facility]NT at 2 months post-op: 09/10/21 at 10:00am

## 2021-07-21 NOTE — Patient Instructions (Addendum)
Start with about 1oz portions of lean, moist, tender protein foods. Eat slowly and allow up to 30 minutes to eat. Avoid fluids at least 15 minutes before eating, during meals, and 30 minutes after. Add some low carb vegetables only when able to eat at least 1-2oz protein foods first and consistently meet daily protein goal. Great job incorporating walking, keep it up!

## 2021-07-22 ENCOUNTER — Other Ambulatory Visit: Payer: Self-pay

## 2021-07-22 DIAGNOSIS — F902 Attention-deficit hyperactivity disorder, combined type: Secondary | ICD-10-CM | POA: Diagnosis not present

## 2021-07-22 DIAGNOSIS — Z79899 Other long term (current) drug therapy: Secondary | ICD-10-CM | POA: Diagnosis not present

## 2021-07-22 MED ORDER — VYVANSE 40 MG PO CAPS
ORAL_CAPSULE | ORAL | 0 refills | Status: DC
  Filled 2021-07-22 – 2021-08-24 (×2): qty 30, 30d supply, fill #0

## 2021-08-06 ENCOUNTER — Other Ambulatory Visit: Payer: Self-pay

## 2021-08-09 ENCOUNTER — Other Ambulatory Visit: Payer: Self-pay

## 2021-08-24 ENCOUNTER — Other Ambulatory Visit: Payer: Self-pay

## 2021-09-07 ENCOUNTER — Other Ambulatory Visit: Payer: Self-pay

## 2021-09-08 ENCOUNTER — Other Ambulatory Visit: Payer: Self-pay

## 2021-09-10 ENCOUNTER — Encounter: Payer: 59 | Attending: Surgery | Admitting: Dietician

## 2021-09-10 ENCOUNTER — Encounter: Payer: Self-pay | Admitting: Dietician

## 2021-09-10 ENCOUNTER — Other Ambulatory Visit: Payer: Self-pay

## 2021-09-10 DIAGNOSIS — Z6841 Body Mass Index (BMI) 40.0 and over, adult: Secondary | ICD-10-CM | POA: Insufficient documentation

## 2021-09-10 NOTE — Patient Instructions (Signed)
Great job making healthy choices and increasing exercise!! Keep it up! Good easy snack options -- veggies and hummus with added unflavored protein powder, Greek yogurt (low fat, low sugar), boiled eggs, [place] pepperoni Continue to use baritastic app for more meal and snack ideas.  Keep increasing low carb veggies as tolerated after consuming protein, can start adding small amounts 2-4 Tbsp of peas, sweet potato, or baby potatoes with skin if you still have room.

## 2021-09-10 NOTE — Progress Notes (Signed)
Nutrition Therapy for Post-Operative Bariatric Diet Follow-up visit:  2 months post-op sleeve gastrectomy Surgery  Medical Nutrition Therapy:  Appt start time: 1010 end time:  1045  Anthropometrics:  Date 05/21/21 07/21/21 09/10/21  BMI 49.7 45.2 41.5  Weight (lbs) 356.2 323.7 297.7  Skeletal muscle (lbs) 100.3 89.5 88.0  % body fat 50.7 51.1 47.2   Clinical: Medications: buPROPion, docusate sodium, lisdexamfetamine, pantoprazole, probiotic Supplementation: bariatric mutivitamin 1x daily; calcium 3x daily Health/ medical history changes: no changes GI symptoms: N/V/D/C: constipation -- takes Colace daily with some relief Dumping Syndrome: no Hair loss: no  Dietary/ Lifestyle Progress: Started taking multivitamin tablets rather than vitamin patch (inadequate absorption) or chewables (nausea), with clearance from Dr. Hassell Done and doing well.  Reports some severe leg cramps during the night, almost nightly. She feels she needs to increase water consumption; she is drinking some sugar free sports beverages to help with electrolytes. She has some days during which she has little hunger or appetite, and then relies more on protein shakes for nutrition. She is experimenting with protein powder in beverages and vegetable based smoothies as well as soups.   Dietary recall: Eating pattern: 3 meals + 1-2 protein shakes + occasional snacks as needed Dining out: 1-2? Eats small amount protein, veg from spouse's plate  Snack: protein shake almond/ macadamia milk with protein powder or Premier shake Breakfast:egg white with [place] sausage or ham on workdays recently with some spinach + mushroom, onion, cheese Snack: occ 1/2 yogurt; small cup of salami/ pepp and cheese  Lunch: small solid meal -- chicken/ tofu + sometimes veg depending on hunger; 1/4 - 1/2 of "power bowl" low carb frozen meal Snack:  Dinner: protein shake when not hungry and unable to eat; solid protein ie 1/4 lamb gyro (no bread), 4  bites of grapefruit; ground [place] wrapped in cheese, lite sour cream; chicken with skinny dressing; imitation crab with lowfat mayo Snack: none  Fluid intake: water 24-32oz daily + 16oz body armor lyte or clear premier protein; soup with added protein; 1-2 protein shakes -- 55-65oz daily Estimated total protein intake: 70-90g daily Bariatric diet adherence:  Using straws: occasionally when dining out, sips slowly. Has noticed more belching after using straw Drinking fluids during meals: no Carbonated beverages: no  Recent physical activity:  stationary bike, walking, some strength buildng exercising + active job, 45 min or more on days off   Nutrition Intervention:   Reviewed progress since previous visit. Commended patient for adhering to diet guidelines and increasing exercise. Instructed on advancing diet to gradually include high fiber starchy veg and/or small portions unsweetened fruits, only after first eating protein and low carb vegetables.  Discussed potential dietary factors affecting leg cramping including fluid, electrolyte intake. Discussed other meal and snack options, resources for meal ideas, ways to use protein powder in foods.   Nutritional Diagnosis:  [place]-3.3 Overweight/obesity As related to history of excess calories and inadequate physical activity.  As evidenced by patient with current BMI of 41.5, following bariatric diet guidelines for ongoing weight loss after sleeve gastrectomy.  Teaching Method Utilized:  Visual Auditory Hands on  Materials provided: Phase 4 and 5 bariatric diet handouts Visit summary with goals/ instructions to be viewed via [place]:  Change in progress  Barriers to learning/adherence to lifestyle change: none  Demonstrated degree of understanding via:  Teach Back      Plan: Return for 6 month post-op visit on 01/14/22 at 10:00am

## 2021-09-13 ENCOUNTER — Encounter: Payer: Self-pay | Admitting: Emergency Medicine

## 2021-09-13 ENCOUNTER — Ambulatory Visit
Admission: EM | Admit: 2021-09-13 | Discharge: 2021-09-13 | Disposition: A | Discharge status: Home / Self Care | Payer: 59 | Attending: Emergency Medicine | Admitting: Emergency Medicine

## 2021-09-13 ENCOUNTER — Other Ambulatory Visit: Payer: Self-pay

## 2021-09-13 DIAGNOSIS — L03011 Cellulitis of right finger: Secondary | ICD-10-CM | POA: Diagnosis not present

## 2021-09-13 MED ORDER — TETANUS-DIPHTH-ACELL PERTUSSIS 5-2.5-18.5 LF-MCG/0.5 IM SUSY
0.5000 mL | PREFILLED_SYRINGE | Freq: Once | INTRAMUSCULAR | Status: AC
  Administered 2021-09-13: 0.5 mL via INTRAMUSCULAR

## 2021-09-13 MED ORDER — PANTOPRAZOLE SODIUM 40 MG PO TBEC
40.0000 mg | DELAYED_RELEASE_TABLET | Freq: Every day | ORAL | 0 refills | Status: DC
  Filled 2021-09-13: qty 90, 90d supply, fill #0

## 2021-09-13 MED ORDER — SULFAMETHOXAZOLE-TRIMETHOPRIM 800-160 MG PO TABS
1.0000 | ORAL_TABLET | Freq: Two times a day (BID) | ORAL | 0 refills | Status: AC
  Filled 2021-09-13: qty 14, 7d supply, fill #0

## 2021-09-13 NOTE — ED Provider Notes (Signed)
Roderic Palau    CSN: [id number] Arrival date & time: 09/13/21  0949      History   Chief Complaint Chief Complaint  Patient presents with   Hand Pain    HPI Rexanne Halina Asano is a 36 y.o. female.  Patient presents with redness, swelling, and pain surrounding a cut on her right index finger since yesterday.  She cut her finger on a copier at work 5 days ago.  The cut drained pus yesterday.  She denies fever, chills, numbness, weakness, paresthesias, or other symptoms.  Treated with ice pack and topical antibiotic ointment.  Her medical history includes endometriosis, anxiety, depression, kidney stones, sleeve gastrectomy on 07/06/2021.  Last tetanus 2012.   The history is provided by the patient and medical records.   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   Cyst of skin of breast 01/23/2013   Abnormal TSH 01/11/2013   Breast mass, right 01/11/2013   Depression 01/11/2013   Obesity 10/12/2012   Anxiety     Past Surgical History:  Procedure Laterality Date   ABLATION ON ENDOMETRIOSIS  2006   age 64   CYST REMOVAL HAND Left 09/05/2005   left finger   DENTAL SURGERY  09/05/1994   FRACTURE SURGERY  09/05/2010   broken finger   TUBAL LIGATION  06/25/2009   UPPER GI ENDOSCOPY N/A 07/06/2021   Procedure: UPPER GI ENDOSCOPY;  Surgeon: Johnathan Hausen, MD;  Location: [facility];  Service: General;  Laterality: N/A;    OB History     Gravida  5   Para  3   Term      Preterm      AB  2   Living  3      SAB  2   IAB      Ectopic      Multiple      Live Births           Obstetric Comments  1st  Menstrual Cycle: 17 1st Pregnancy:  20          Home Medications    Prior to Admission medications   Medication Sig Start Date End Date Taking? Authorizing Provider  CALCIUM CITRATE PO Take 500 mg by mouth in the morning, at noon, and at bedtime.   Yes [provider]  lisdexamfetamine (VYVANSE) 40 MG capsule 1 capsule by mouth daily 07/22/21  Yes   Multiple Vitamins-Minerals (BARIATRIC MULTIVITAMINS/IRON) CAPS Take 1 tablet by mouth daily.   Yes [provider]  pantoprazole (PROTONIX) 40 MG tablet Take 1 tablet (40 mg total) by mouth daily. 07/07/21  Yes Johnathan Hausen, MD  Probiotic Product (ACIDOPHILUS) [place]  07/08/21  Yes [provider]  sulfamethoxazole-trimethoprim (BACTRIM DS) 800-160 MG tablet Take 1 tablet by mouth 2 (two) times daily for 7 days. 09/13/21 09/20/21 Yes Sharion Balloon, NP  buPROPion (WELLBUTRIN XL) 150 MG 24 hr tablet TAKE 1 TABLET BY MOUTH ONCE DAILY 10/09/20 10/09/21  Hepler, Elta Guadeloupe, PA-C  diphenhydrAMINE (BENADRYL) 25 MG tablet Take 50 mg by  mouth daily as needed for allergies. Patient not taking: Reported on 07/21/2021    [provider]  docusate sodium (COLACE) 100 MG capsule Take 100 mg by mouth 2 (two) times daily.    [provider]  ondansetron (ZOFRAN-ODT) 4 MG disintegrating tablet Dissolve 1 tablet (4 mg total) by mouth every 6 (six) hours as needed for nausea or vomiting. 07/07/21   Johnathan Hausen, MD  oxyCODONE (OXY IR/ROXICODONE) 5 MG immediate release tablet Take 1 tablet (5 mg total) by mouth every 6 (six) hours as needed for severe pain. 07/07/21   Johnathan Hausen, MD    Family History Family History  Problem Relation Age of Onset   Hypothyroidism Mother    Hypothyroidism Maternal Aunt    Cancer Paternal Aunt 85       breast CA    Social History Social History   Tobacco Use   Smoking status: Never   Smokeless tobacco: Never  Vaping Use   Vaping Use: Never used  Substance Use Topics   Alcohol use:  Yes    Comment: occassional   Drug use: No     Allergies   Penicillins   Review of Systems Review of Systems  Constitutional:  Negative for chills and fever.  Skin:  Positive for color change and wound.  Neurological:  Negative for weakness and numbness.  All other systems reviewed and are negative.   Physical Exam Triage Vital Signs ED Triage Vitals  Enc Vitals Group     BP      Pulse      Resp      Temp      Temp src      SpO2      Weight      Height      Head Circumference      Peak Flow      Pain Score      Pain Loc      Pain Edu?      Excl. in [place]?    No data found.  Updated Vital Signs BP 111/77 (BP Location: Left Arm)    Pulse 76    Temp 98.1 F (36.7 C)    Resp 18    LMP 09/06/2021    SpO2 98%   Visual Acuity Right Eye Distance:   Left Eye Distance:   Bilateral Distance:    Right Eye Near:   Left Eye Near:    Bilateral Near:     Physical Exam Vitals and nursing note reviewed.  Constitutional:      General: She is not in acute distress.    Appearance: She is well-developed. She is not ill-appearing.  HENT:     Mouth/Throat:     Mouth: Mucous membranes are moist.  Cardiovascular:     Rate and Rhythm: Normal rate and regular rhythm.     Heart sounds: Normal heart sounds.  Pulmonary:     Effort: Pulmonary effort is normal. No respiratory distress.     Breath sounds: Normal breath sounds.  Musculoskeletal:        General: Swelling and tenderness present. Normal range of motion.       Hands:     Cervical back: Neck supple.  Skin:    General: Skin is warm and dry.     Capillary Refill: Capillary refill takes less than 2 seconds.     Findings: Lesion present.     Comments: Mild erythema and mild edema surrounding 3 mm wound on right index  finger; scant clear drainage.   Neurological:     General: No focal deficit present.     Mental Status: She is alert and oriented to person, place, and time.     Sensory: No sensory deficit.     Motor:  No weakness.  Psychiatric:        Mood and Affect: Mood normal.        Behavior: Behavior normal.     UC Treatments / Results  Labs (all labs ordered are listed, but only abnormal results are displayed) Labs Reviewed - No data to display  EKG   Radiology No results found.  Procedures Procedures (including critical care time)  Medications Ordered in UC Medications  Tdap (BOOSTRIX) injection 0.5 mL (0.5 mLs Intramuscular Given 09/13/21 1024)    Initial Impression / Assessment and Plan / UC Course  I have reviewed the triage vital signs and the nursing notes.  Pertinent labs & imaging results that were available during my care of the patient were reviewed by me and considered in my medical decision making (see chart for details).   Cellulitis of right index finger.  Tetanus updated.  Treating with Bactrim.  Wound care instructions and signs of worsening infection discussed with patient.  Education provided on cellulitis.  Instructed patient to follow-up with her PCP if her symptoms are not improving.  She agrees to plan of care.    Final Clinical Impressions(s) / UC Diagnoses   Final diagnoses:  Cellulitis of right index finger     Discharge Instructions      Keep your wound clean and dry.  Wash it gently twice a day with soap and water.  Apply an antibiotic cream and bandage twice a day.  Follow up with your primary care provider or return here if you see signs of worsening infection, such as increased pain, redness, pus-like drainage, warmth, fever, chills, or other concerning symptoms.          ED Prescriptions     Medication Sig Dispense Auth. Provider   sulfamethoxazole-trimethoprim (BACTRIM DS) 800-160 MG tablet Take 1 tablet by mouth 2 (two) times daily for 7 days. 14 tablet Sharion Balloon, NP      PDMP not reviewed this encounter.   Sharion Balloon, NP 09/13/21 1025

## 2021-09-13 NOTE — Discharge Instructions (Addendum)
Keep your wound clean and dry.  Wash it gently twice a day with soap and water.  Apply an antibiotic cream and bandage twice a day.  Follow up with your primary care provider or return here if you see signs of worsening infection, such as increased pain, redness, pus-like drainage, warmth, fever, chills, or other concerning symptoms.

## 2021-09-13 NOTE — ED Triage Notes (Signed)
Pt cut her right pointer finger 1 week ago and yesterday it started throbbing and draining.

## 2021-09-20 ENCOUNTER — Other Ambulatory Visit: Payer: Self-pay

## 2021-09-25 ENCOUNTER — Other Ambulatory Visit: Payer: Self-pay

## 2021-09-27 ENCOUNTER — Other Ambulatory Visit: Payer: Self-pay

## 2021-09-27 MED ORDER — VYVANSE 40 MG PO CAPS
ORAL_CAPSULE | ORAL | 0 refills | Status: DC
  Filled 2021-09-27: qty 30, 30d supply, fill #0

## 2021-09-30 ENCOUNTER — Other Ambulatory Visit: Payer: Self-pay

## 2021-10-13 DIAGNOSIS — Z79899 Other long term (current) drug therapy: Secondary | ICD-10-CM | POA: Diagnosis not present

## 2021-10-13 DIAGNOSIS — F902 Attention-deficit hyperactivity disorder, combined type: Secondary | ICD-10-CM | POA: Diagnosis not present

## 2021-10-26 ENCOUNTER — Other Ambulatory Visit: Payer: Self-pay

## 2021-10-26 MED ORDER — VYVANSE 40 MG PO CAPS
ORAL_CAPSULE | ORAL | 0 refills | Status: DC
  Filled 2021-10-26: qty 30, 30d supply, fill #0

## 2021-12-01 ENCOUNTER — Other Ambulatory Visit: Payer: Self-pay

## 2021-12-01 MED ORDER — VYVANSE 40 MG PO CAPS
ORAL_CAPSULE | ORAL | 0 refills | Status: DC
  Filled 2021-12-01: qty 30, 30d supply, fill #0

## 2021-12-09 DIAGNOSIS — F331 Major depressive disorder, recurrent, moderate: Secondary | ICD-10-CM | POA: Diagnosis not present

## 2021-12-09 DIAGNOSIS — F411 Generalized anxiety disorder: Secondary | ICD-10-CM | POA: Diagnosis not present

## 2021-12-09 DIAGNOSIS — F900 Attention-deficit hyperactivity disorder, predominantly inattentive type: Secondary | ICD-10-CM | POA: Diagnosis not present

## 2021-12-15 DIAGNOSIS — F411 Generalized anxiety disorder: Secondary | ICD-10-CM | POA: Diagnosis not present

## 2021-12-15 DIAGNOSIS — F900 Attention-deficit hyperactivity disorder, predominantly inattentive type: Secondary | ICD-10-CM | POA: Diagnosis not present

## 2021-12-15 DIAGNOSIS — F331 Major depressive disorder, recurrent, moderate: Secondary | ICD-10-CM | POA: Diagnosis not present

## 2021-12-22 DIAGNOSIS — F411 Generalized anxiety disorder: Secondary | ICD-10-CM | POA: Diagnosis not present

## 2021-12-22 DIAGNOSIS — F900 Attention-deficit hyperactivity disorder, predominantly inattentive type: Secondary | ICD-10-CM | POA: Diagnosis not present

## 2021-12-22 DIAGNOSIS — F331 Major depressive disorder, recurrent, moderate: Secondary | ICD-10-CM | POA: Diagnosis not present

## 2021-12-29 DIAGNOSIS — F331 Major depressive disorder, recurrent, moderate: Secondary | ICD-10-CM | POA: Diagnosis not present

## 2021-12-29 DIAGNOSIS — F900 Attention-deficit hyperactivity disorder, predominantly inattentive type: Secondary | ICD-10-CM | POA: Diagnosis not present

## 2021-12-29 DIAGNOSIS — F411 Generalized anxiety disorder: Secondary | ICD-10-CM | POA: Diagnosis not present

## 2022-01-03 ENCOUNTER — Other Ambulatory Visit: Payer: Self-pay

## 2022-01-03 MED ORDER — VYVANSE 40 MG PO CAPS
ORAL_CAPSULE | ORAL | 0 refills | Status: DC
  Filled 2022-01-03: qty 30, 30d supply, fill #0

## 2022-01-04 ENCOUNTER — Other Ambulatory Visit: Payer: Self-pay

## 2022-01-04 DIAGNOSIS — E559 Vitamin D deficiency, unspecified: Secondary | ICD-10-CM | POA: Diagnosis not present

## 2022-01-04 DIAGNOSIS — D519 Vitamin B12 deficiency anemia, unspecified: Secondary | ICD-10-CM | POA: Diagnosis not present

## 2022-01-04 DIAGNOSIS — Z79899 Other long term (current) drug therapy: Secondary | ICD-10-CM | POA: Diagnosis not present

## 2022-01-04 DIAGNOSIS — E039 Hypothyroidism, unspecified: Secondary | ICD-10-CM | POA: Diagnosis not present

## 2022-01-04 DIAGNOSIS — F411 Generalized anxiety disorder: Secondary | ICD-10-CM | POA: Diagnosis not present

## 2022-01-04 DIAGNOSIS — E7800 Pure hypercholesterolemia, unspecified: Secondary | ICD-10-CM | POA: Diagnosis not present

## 2022-01-04 DIAGNOSIS — D649 Anemia, unspecified: Secondary | ICD-10-CM | POA: Diagnosis not present

## 2022-01-04 DIAGNOSIS — F909 Attention-deficit hyperactivity disorder, unspecified type: Secondary | ICD-10-CM | POA: Diagnosis not present

## 2022-01-04 DIAGNOSIS — E119 Type 2 diabetes mellitus without complications: Secondary | ICD-10-CM | POA: Diagnosis not present

## 2022-01-05 DIAGNOSIS — F411 Generalized anxiety disorder: Secondary | ICD-10-CM | POA: Diagnosis not present

## 2022-01-05 DIAGNOSIS — F900 Attention-deficit hyperactivity disorder, predominantly inattentive type: Secondary | ICD-10-CM | POA: Diagnosis not present

## 2022-01-05 DIAGNOSIS — F331 Major depressive disorder, recurrent, moderate: Secondary | ICD-10-CM | POA: Diagnosis not present

## 2022-01-07 DIAGNOSIS — Z1339 Encounter for screening examination for other mental health and behavioral disorders: Secondary | ICD-10-CM | POA: Diagnosis not present

## 2022-01-07 DIAGNOSIS — Z1331 Encounter for screening for depression: Secondary | ICD-10-CM | POA: Diagnosis not present

## 2022-01-07 DIAGNOSIS — Z01419 Encounter for gynecological examination (general) (routine) without abnormal findings: Secondary | ICD-10-CM | POA: Diagnosis not present

## 2022-01-07 DIAGNOSIS — Z124 Encounter for screening for malignant neoplasm of cervix: Secondary | ICD-10-CM | POA: Diagnosis not present

## 2022-01-07 LAB — HM PAP SMEAR: HM Pap smear: NEGATIVE

## 2022-01-12 DIAGNOSIS — F331 Major depressive disorder, recurrent, moderate: Secondary | ICD-10-CM | POA: Diagnosis not present

## 2022-01-12 DIAGNOSIS — F411 Generalized anxiety disorder: Secondary | ICD-10-CM | POA: Diagnosis not present

## 2022-01-12 DIAGNOSIS — F900 Attention-deficit hyperactivity disorder, predominantly inattentive type: Secondary | ICD-10-CM | POA: Diagnosis not present

## 2022-01-14 ENCOUNTER — Encounter: Payer: 59 | Attending: Surgery | Admitting: Dietician

## 2022-01-14 DIAGNOSIS — Z713 Dietary counseling and surveillance: Secondary | ICD-10-CM | POA: Insufficient documentation

## 2022-01-14 DIAGNOSIS — Z6835 Body mass index (BMI) 35.0-35.9, adult: Secondary | ICD-10-CM | POA: Insufficient documentation

## 2022-01-14 NOTE — Patient Instructions (Signed)
Great job increasing exercise and follow diet guidelines! Keep it up! ?Concentrate on slow eating and chewing foods thoroughly to help prevent stomach pain. Take a few minutes to relax/ de-stress if needed before eating. ?

## 2022-01-14 NOTE — Progress Notes (Signed)
Nutrition Therapy for Post-Operative Bariatric Diet ?Follow-up visit:  6 months post-op sleeve gastrectomy Surgery ? ?Medical Nutrition Therapy:  Appt start time: 1030 end time:  1100. ? ?Anthropometrics: ? ?Date 05/21/21 07/21/21 09/10/21 01/14/22  ?BMI 49.7 45.2 41.5 35.9  ?Weight (lbs) 356.2 323.7 297.7 257.1  ?Skeletal muscle (lbs) 100.3 89.5 88.0 90.2  ?% body fat 50.7 51.1 47.2 37.2  ? ?Clinical: ?Medications: docusate sodium, lisdexamfetamine, ondansetron prn ?Supplementation: bariatric multivitamin 2x daily, calcium 3x daily, probiotic, l-lysine ?Health/ medical history changes: no changes ?GI symptoms: N/V/D/C: 2 episodes of pain followed by vomiting, see note below. Some days has anorexia/ food aversion and will drink 2 protein shakes. Also reports frequent GERD symptoms -- taking calcium supplement (Tums) at night helps. ?Dumping Syndrome: 2 episodes after eating small amounts of sugar, experienced watery stools for 1 day after sugar consumption. ?Hair loss: yes began 11/2021; now using shampoo for hair thinning and is improving. ? ?Dietary/ Lifestyle Progress: ?Patient reports occasional abdominal pain, sometimes due to eating quickly -- not chewing as well. Tried lean pulled pork at a restaurant and had pain after about 5 bites and vomited. Had same experience after trying corned beef. ?Is able to eat a bit more volume of food with meals.  ?Has had some cravings for sweets, is trying sugar free/ low sugar options from bariatric/ keto friendly sources ?Continues to track intake with baritastic app, reaching 80-90g protein most days; average kcal intake is 846.  ? ?Dietary recall: ?Eating pattern: 3 meals and 1-2 snacks daily ? ?Breakfast: eggs, cheese occ 1pc bacon; keto overnight oats ?Snack: yogurt; kind bar; sugar free peanut butter with small portion fruit  ?Lunch: protein ie chicken + low carb vegetables ?Snack: [place] pepperoni and cheese; [place] yogurt; keto trail mix nuts and seeds  ?Dinner: lean  protein + veg, occasionally 1-2 bites of a starchy food; sometimes not hungry and will have protein shake ?Snack: none ? ?Fluid intake: coffee sugar free flavoring; 16oz flavored water; water 16-48oz; 1-2 protein shakes ?Estimated total protein intake: 80-90g most days ?Bariatric diet adherence:  ?Using straws: yes, denies any issues (with ice coffee and water) ?Drinking fluids during meals: no ?Carbonated beverages: no ? ?Recent physical activity:  gym workout 3x a week + some walking ? ? ?Nutrition Intervention:   ?Reviewed progress since previous visit ?Weight loss is now almost at 100lbs since 05/2021. Discussed significant reduction in body fat percentage and increase in muscle mass and commended patient for her efforts to improve body composition. ?Patient is working to slow eating at meals and increase chewing in hopes of reducing abdominal pain episodes.  ?Discussed possibility of stress/ anxiety as trigger for pain/ nausea/vomiting. ?Instructed on ongoing diet advancement to include all vegetables, some fruits, and gradually increase to 1000kcal to meet daily nutritional needs while continuing with weight loss. ? ?Nutritional Diagnosis:  ?[place]-3.3 Overweight/obesity As related to history of excess calories and inadequate physical activity.  As evidenced by patient with current BMI of 35.7, following bariatric diet guidelines for ongoing weight loss after sleeve gastrectomy. ? ?Teaching Method Utilized:  ?Visual ?Auditory ? ?Materials provided: ?Bariatric diet stage 7 handout ?1000-1200kcal bariatric menus ?Visit summary with goals/ instructions to be viewed in patient portal ? ?Learning Readiness:  ?Change in progress ? ?Barriers to learning/adherence to lifestyle change: none ? ?Demonstrated degree of understanding via:  Teach Back  ?   ? ?Plan: ?Return for follow up at 9 months post-op: 04/15/22 at 10:15am ?  ?

## 2022-01-19 DIAGNOSIS — F900 Attention-deficit hyperactivity disorder, predominantly inattentive type: Secondary | ICD-10-CM | POA: Diagnosis not present

## 2022-01-19 DIAGNOSIS — F411 Generalized anxiety disorder: Secondary | ICD-10-CM | POA: Diagnosis not present

## 2022-01-19 DIAGNOSIS — F331 Major depressive disorder, recurrent, moderate: Secondary | ICD-10-CM | POA: Diagnosis not present

## 2022-01-26 DIAGNOSIS — F411 Generalized anxiety disorder: Secondary | ICD-10-CM | POA: Diagnosis not present

## 2022-01-26 DIAGNOSIS — F331 Major depressive disorder, recurrent, moderate: Secondary | ICD-10-CM | POA: Diagnosis not present

## 2022-01-26 DIAGNOSIS — F900 Attention-deficit hyperactivity disorder, predominantly inattentive type: Secondary | ICD-10-CM | POA: Diagnosis not present

## 2022-02-01 ENCOUNTER — Other Ambulatory Visit: Payer: Self-pay

## 2022-02-01 DIAGNOSIS — Z79899 Other long term (current) drug therapy: Secondary | ICD-10-CM | POA: Diagnosis not present

## 2022-02-01 DIAGNOSIS — F909 Attention-deficit hyperactivity disorder, unspecified type: Secondary | ICD-10-CM | POA: Diagnosis not present

## 2022-02-02 ENCOUNTER — Other Ambulatory Visit: Payer: Self-pay

## 2022-02-02 MED ORDER — VYVANSE 40 MG PO CAPS
40.0000 mg | ORAL_CAPSULE | Freq: Every morning | ORAL | 0 refills | Status: DC
  Filled 2022-02-02 (×2): qty 30, 30d supply, fill #0

## 2022-02-02 MED ORDER — VYVANSE 40 MG PO CAPS
40.0000 mg | ORAL_CAPSULE | Freq: Every morning | ORAL | 0 refills | Status: DC
  Filled 2022-04-20: qty 30, 30d supply, fill #0

## 2022-02-02 MED ORDER — VYVANSE 40 MG PO CAPS
40.0000 mg | ORAL_CAPSULE | Freq: Every morning | ORAL | 0 refills | Status: DC
  Filled 2022-03-11: qty 30, 30d supply, fill #0

## 2022-03-11 ENCOUNTER — Other Ambulatory Visit: Payer: Self-pay

## 2022-03-14 ENCOUNTER — Other Ambulatory Visit: Payer: Self-pay

## 2022-03-16 DIAGNOSIS — F411 Generalized anxiety disorder: Secondary | ICD-10-CM | POA: Diagnosis not present

## 2022-03-16 DIAGNOSIS — F331 Major depressive disorder, recurrent, moderate: Secondary | ICD-10-CM | POA: Diagnosis not present

## 2022-03-16 DIAGNOSIS — F900 Attention-deficit hyperactivity disorder, predominantly inattentive type: Secondary | ICD-10-CM | POA: Diagnosis not present

## 2022-03-30 DIAGNOSIS — F331 Major depressive disorder, recurrent, moderate: Secondary | ICD-10-CM | POA: Diagnosis not present

## 2022-03-30 DIAGNOSIS — F900 Attention-deficit hyperactivity disorder, predominantly inattentive type: Secondary | ICD-10-CM | POA: Diagnosis not present

## 2022-03-30 DIAGNOSIS — F411 Generalized anxiety disorder: Secondary | ICD-10-CM | POA: Diagnosis not present

## 2022-04-13 DIAGNOSIS — F331 Major depressive disorder, recurrent, moderate: Secondary | ICD-10-CM | POA: Diagnosis not present

## 2022-04-13 DIAGNOSIS — F411 Generalized anxiety disorder: Secondary | ICD-10-CM | POA: Diagnosis not present

## 2022-04-13 DIAGNOSIS — F900 Attention-deficit hyperactivity disorder, predominantly inattentive type: Secondary | ICD-10-CM | POA: Diagnosis not present

## 2022-04-15 ENCOUNTER — Ambulatory Visit: Payer: 59 | Admitting: Dietician

## 2022-04-18 ENCOUNTER — Other Ambulatory Visit: Payer: Self-pay

## 2022-04-20 ENCOUNTER — Other Ambulatory Visit: Payer: Self-pay

## 2022-04-27 DIAGNOSIS — F900 Attention-deficit hyperactivity disorder, predominantly inattentive type: Secondary | ICD-10-CM | POA: Diagnosis not present

## 2022-04-27 DIAGNOSIS — F411 Generalized anxiety disorder: Secondary | ICD-10-CM | POA: Diagnosis not present

## 2022-04-27 DIAGNOSIS — F331 Major depressive disorder, recurrent, moderate: Secondary | ICD-10-CM | POA: Diagnosis not present

## 2022-05-04 DIAGNOSIS — F331 Major depressive disorder, recurrent, moderate: Secondary | ICD-10-CM | POA: Diagnosis not present

## 2022-05-04 DIAGNOSIS — F900 Attention-deficit hyperactivity disorder, predominantly inattentive type: Secondary | ICD-10-CM | POA: Diagnosis not present

## 2022-05-04 DIAGNOSIS — F411 Generalized anxiety disorder: Secondary | ICD-10-CM | POA: Diagnosis not present

## 2022-05-12 DIAGNOSIS — F411 Generalized anxiety disorder: Secondary | ICD-10-CM | POA: Diagnosis not present

## 2022-05-12 DIAGNOSIS — F900 Attention-deficit hyperactivity disorder, predominantly inattentive type: Secondary | ICD-10-CM | POA: Diagnosis not present

## 2022-05-12 DIAGNOSIS — F331 Major depressive disorder, recurrent, moderate: Secondary | ICD-10-CM | POA: Diagnosis not present

## 2022-05-20 ENCOUNTER — Other Ambulatory Visit: Payer: Self-pay

## 2022-05-22 ENCOUNTER — Other Ambulatory Visit: Payer: Self-pay

## 2022-05-23 ENCOUNTER — Other Ambulatory Visit: Payer: Self-pay

## 2022-05-24 ENCOUNTER — Other Ambulatory Visit: Payer: Self-pay

## 2022-05-25 ENCOUNTER — Other Ambulatory Visit: Payer: Self-pay

## 2022-05-25 DIAGNOSIS — R5381 Other malaise: Secondary | ICD-10-CM | POA: Diagnosis not present

## 2022-05-25 DIAGNOSIS — F909 Attention-deficit hyperactivity disorder, unspecified type: Secondary | ICD-10-CM | POA: Diagnosis not present

## 2022-05-25 DIAGNOSIS — Z79899 Other long term (current) drug therapy: Secondary | ICD-10-CM | POA: Diagnosis not present

## 2022-05-25 DIAGNOSIS — E7800 Pure hypercholesterolemia, unspecified: Secondary | ICD-10-CM | POA: Diagnosis not present

## 2022-05-25 DIAGNOSIS — E039 Hypothyroidism, unspecified: Secondary | ICD-10-CM | POA: Diagnosis not present

## 2022-05-25 DIAGNOSIS — E559 Vitamin D deficiency, unspecified: Secondary | ICD-10-CM | POA: Diagnosis not present

## 2022-05-25 DIAGNOSIS — R946 Abnormal results of thyroid function studies: Secondary | ICD-10-CM | POA: Diagnosis not present

## 2022-05-25 DIAGNOSIS — R5383 Other fatigue: Secondary | ICD-10-CM | POA: Diagnosis not present

## 2022-05-25 MED ORDER — LISDEXAMFETAMINE DIMESYLATE 50 MG PO CAPS
50.0000 mg | ORAL_CAPSULE | Freq: Every morning | ORAL | 0 refills | Status: DC
  Filled 2022-06-27: qty 30, 30d supply, fill #0

## 2022-05-25 MED ORDER — LISDEXAMFETAMINE DIMESYLATE 50 MG PO CAPS
50.0000 mg | ORAL_CAPSULE | Freq: Every morning | ORAL | 0 refills | Status: DC
  Filled 2022-07-31: qty 30, 30d supply, fill #0

## 2022-05-25 MED ORDER — LISDEXAMFETAMINE DIMESYLATE 50 MG PO CAPS
50.0000 mg | ORAL_CAPSULE | Freq: Every morning | ORAL | 0 refills | Status: DC
  Filled 2022-05-25: qty 30, 30d supply, fill #0

## 2022-06-15 ENCOUNTER — Encounter: Payer: Self-pay | Admitting: Dietician

## 2022-06-15 NOTE — Progress Notes (Signed)
Have not heard back from patient to reschedule her cancelled appointment from 04/15/22. Sent notification to referring provider.

## 2022-06-27 ENCOUNTER — Other Ambulatory Visit: Payer: Self-pay

## 2022-07-31 ENCOUNTER — Other Ambulatory Visit: Payer: Self-pay

## 2022-08-01 ENCOUNTER — Other Ambulatory Visit: Payer: Self-pay

## 2022-08-22 ENCOUNTER — Other Ambulatory Visit: Payer: Self-pay

## 2022-08-22 DIAGNOSIS — E119 Type 2 diabetes mellitus without complications: Secondary | ICD-10-CM | POA: Diagnosis not present

## 2022-08-22 DIAGNOSIS — Z79899 Other long term (current) drug therapy: Secondary | ICD-10-CM | POA: Diagnosis not present

## 2022-08-22 DIAGNOSIS — E039 Hypothyroidism, unspecified: Secondary | ICD-10-CM | POA: Diagnosis not present

## 2022-08-22 DIAGNOSIS — F339 Major depressive disorder, recurrent, unspecified: Secondary | ICD-10-CM | POA: Diagnosis not present

## 2022-08-22 DIAGNOSIS — E7800 Pure hypercholesterolemia, unspecified: Secondary | ICD-10-CM | POA: Diagnosis not present

## 2022-08-22 DIAGNOSIS — R5381 Other malaise: Secondary | ICD-10-CM | POA: Diagnosis not present

## 2022-08-22 DIAGNOSIS — R5383 Other fatigue: Secondary | ICD-10-CM | POA: Diagnosis not present

## 2022-08-22 DIAGNOSIS — F909 Attention-deficit hyperactivity disorder, unspecified type: Secondary | ICD-10-CM | POA: Diagnosis not present

## 2022-08-22 DIAGNOSIS — E559 Vitamin D deficiency, unspecified: Secondary | ICD-10-CM | POA: Diagnosis not present

## 2022-08-22 MED ORDER — BUPROPION HCL ER (XL) 150 MG PO TB24
150.0000 mg | ORAL_TABLET | Freq: Every day | ORAL | 2 refills | Status: DC
  Filled 2022-08-22: qty 30, 30d supply, fill #0
  Filled 2022-09-26: qty 30, 30d supply, fill #1
  Filled 2022-10-24: qty 30, 30d supply, fill #2

## 2022-08-22 MED ORDER — LISDEXAMFETAMINE DIMESYLATE 50 MG PO CAPS
50.0000 mg | ORAL_CAPSULE | Freq: Every day | ORAL | 0 refills | Status: DC
  Filled 2022-11-07: qty 30, 30d supply, fill #0

## 2022-08-22 MED ORDER — LISDEXAMFETAMINE DIMESYLATE 50 MG PO CAPS
50.0000 mg | ORAL_CAPSULE | Freq: Every day | ORAL | 0 refills | Status: DC
  Filled 2022-09-06: qty 30, 30d supply, fill #0

## 2022-08-22 MED ORDER — LISDEXAMFETAMINE DIMESYLATE 50 MG PO CAPS
50.0000 mg | ORAL_CAPSULE | Freq: Every day | ORAL | 0 refills | Status: DC
  Filled 2022-10-03 – 2022-10-04 (×3): qty 30, 30d supply, fill #0

## 2022-09-06 ENCOUNTER — Other Ambulatory Visit: Payer: Self-pay

## 2022-09-26 ENCOUNTER — Other Ambulatory Visit: Payer: Self-pay

## 2022-10-03 ENCOUNTER — Other Ambulatory Visit: Payer: Self-pay

## 2022-10-04 ENCOUNTER — Other Ambulatory Visit: Payer: Self-pay

## 2022-10-09 DIAGNOSIS — S8002XA Contusion of left knee, initial encounter: Secondary | ICD-10-CM | POA: Diagnosis not present

## 2022-10-24 ENCOUNTER — Other Ambulatory Visit: Payer: Self-pay

## 2022-11-03 ENCOUNTER — Other Ambulatory Visit: Payer: Self-pay

## 2022-11-07 ENCOUNTER — Other Ambulatory Visit: Payer: Self-pay

## 2022-11-07 MED ORDER — LISDEXAMFETAMINE DIMESYLATE 60 MG PO CAPS
60.0000 mg | ORAL_CAPSULE | Freq: Every day | ORAL | 0 refills | Status: DC
  Filled 2022-11-07: qty 20, 20d supply, fill #0
  Filled 2022-11-07: qty 10, 10d supply, fill #0

## 2022-11-09 NOTE — Progress Notes (Unsigned)
There were no vitals taken for this visit.   Subjective:    Patient ID: Lauren Macias, female    DOB: [birth year], 37 y.o.   MRN: [id number]  HPI: Lauren Macias is a 37 y.o. female  No chief complaint on file.  Establish care: her last physical was ***.  Medical history includes ***.  Family history includes ***.  Health maintenance ***.  \ Relevant past medical, surgical, family and social history reviewed and updated as indicated. Interim medical history since our last visit reviewed. Allergies and medications reviewed and updated.  Review of Systems  Constitutional: Negative for fever or weight change.  Respiratory: Negative for cough and shortness of breath.   Cardiovascular: Negative for chest pain or palpitations.  Gastrointestinal: Negative for abdominal pain, no bowel changes.  Musculoskeletal: Negative for gait problem or joint swelling.  Skin: Negative for rash.  Neurological: Negative for dizziness or headache.  No other specific complaints in a complete review of systems (except as listed in HPI above).      Objective:    There were no vitals taken for this visit.  Wt Readings from Last 3 Encounters:  09/10/21 297 lb 11.2 oz (135 kg)  07/21/21 (!) 323 lb 11.2 oz (146.8 kg)  07/06/21 (!) 344 lb 9.6 oz (156.3 kg)    Physical Exam  Constitutional: Patient appears well-developed and well-nourished. Obese *** No distress.  HEENT: head atraumatic, normocephalic, pupils equal and reactive to light, ears ***, neck supple, throat within normal limits Cardiovascular: Normal rate, regular rhythm and normal heart sounds.  No murmur heard. No BLE edema. Pulmonary/Chest: Effort normal and breath sounds normal. No respiratory distress. Abdominal: Soft.  There is no tenderness. Psychiatric: Patient has a normal mood and affect. behavior is normal. Judgment and thought content normal.  Results for orders placed or performed during the hospital encounter of  07/06/21  CBC WITH DIFFERENTIAL  Result Value Ref Range   WBC 6.6 4.0 - 10.5 K/uL   RBC 4.87 3.87 - 5.11 MIL/uL   Hemoglobin 13.9 12.0 - 15.0 g/dL   HCT 43.3 36.0 - 46.0 %   MCV 88.9 80.0 - 100.0 fL   MCH 28.5 26.0 - 34.0 pg   MCHC 32.1 30.0 - 36.0 g/dL   RDW 13.2 11.5 - 15.5 %   Platelets 258 150 - 400 K/uL   nRBC 0.0 0.0 - 0.2 %   Neutrophils Relative % 72 %   Neutro Abs 4.7 1.7 - 7.7 K/uL   Lymphocytes Relative 18 %   Lymphs Abs 1.2 0.7 - 4.0 K/uL   Monocytes Relative 8 %   Monocytes Absolute 0.5 0.1 - 1.0 K/uL   Eosinophils Relative 1 %   Eosinophils Absolute 0.1 0.0 - 0.5 K/uL   Basophils Relative 1 %   Basophils Absolute 0.0 0.0 - 0.1 K/uL   Immature Granulocytes 0 %   Abs Immature Granulocytes 0.02 0.00 - 0.07 K/uL  Comprehensive metabolic panel  Result Value Ref Range   Sodium 135 135 - 145 mmol/L   Potassium 3.7 3.5 - 5.1 mmol/L   Chloride 104 98 - 111 mmol/L   CO2 23 22 - 32 mmol/L   Glucose, Bld 92 70 - 99 mg/dL   BUN 15 6 - 20 mg/dL   Creatinine, Ser 0.75 0.44 - 1.00 mg/dL   Calcium 8.9 8.9 - 10.3 mg/dL   Total Protein 7.6 6.5 - 8.1 g/dL   Albumin 4.2 3.5 - 5.0 g/dL   AST  28 15 - 41 U/L   ALT 27 0 - 44 U/L   Alkaline Phosphatase 74 38 - 126 U/L   Total Bilirubin 0.7 0.3 - 1.2 mg/dL   GFR, Estimated >60 >60 mL/min   Anion gap 8 5 - 15  Pregnancy, urine STAT morning of surgery  Result Value Ref Range   Preg Test, Ur NEGATIVE NEGATIVE  Hemoglobin and hematocrit, blood  Result Value Ref Range   Hemoglobin 13.6 12.0 - 15.0 g/dL   HCT 41.7 36.0 - 46.0 %  CBC WITH DIFFERENTIAL  Result Value Ref Range   WBC 10.4 4.0 - 10.5 K/uL   RBC 4.61 3.87 - 5.11 MIL/uL   Hemoglobin 13.1 12.0 - 15.0 g/dL   HCT 41.1 36.0 - 46.0 %   MCV 89.2 80.0 - 100.0 fL   MCH 28.4 26.0 - 34.0 pg   MCHC 31.9 30.0 - 36.0 g/dL   RDW 13.2 11.5 - 15.5 %   Platelets 283 150 - 400 K/uL   nRBC 0.0 0.0 - 0.2 %   Neutrophils Relative % 89 %   Neutro Abs 9.3 (H) 1.7 - 7.7 K/uL    Lymphocytes Relative 6 %   Lymphs Abs 0.6 (L) 0.7 - 4.0 K/uL   Monocytes Relative 4 %   Monocytes Absolute 0.4 0.1 - 1.0 K/uL   Eosinophils Relative 0 %   Eosinophils Absolute 0.0 0.0 - 0.5 K/uL   Basophils Relative 0 %   Basophils Absolute 0.0 0.0 - 0.1 K/uL   Immature Granulocytes 1 %   Abs Immature Granulocytes 0.06 0.00 - 0.07 K/uL  Type and screen  Result Value Ref Range   ABO/RH(D) A POS    Antibody Screen NEG    Sample Expiration      07/09/2021,2359 Performed at [facility], [place] [address], [place], [place] [address]   Surgical pathology  Result Value Ref Range   SURGICAL PATHOLOGY      SURGICAL PATHOLOGY CASE: [id number] PATIENT: Lilia Argue Surgical Pathology Report     Clinical History: Morbid obesity (crm)     FINAL MICROSCOPIC DIAGNOSIS:  A. STOMACH, GREATER CURVATURE, SLEEVE RESECTION: -Gross diagnosis only: portion of grossly unremarkable stomach  GROSS DESCRIPTION:  The specimen is received fresh and consists of a 23.1 x 5.6 x 1.1 cm portion of tan-pink soft tissue, with stapled resection margin.  The serosal surface is smooth, hyperemic, with small amount of attached tan-yellow adipose tissue.  Lumen is filled with a moderate amount of red-brown, hemorrhagic gelatinous material.  Mucosa ranges from tan-pink to red-purple and dusky, with normal mucosal folding.  No mucosal lesions are grossly identified.  Wall averages 0.2 cm in thickness.  No sections are submitted, gross exam only.  Craig Staggers 07/06/2021)    Final Diagnosis performed by Jaquita Folds, MD.   Electronically signed 07/08/2021 Technical and / or Professional comp onents performed at [facility], [place] [address], [place], [place] [address].  Immunohistochemistry Technical component (if applicable) was performed at [facility]. [address] [place], [place], [place] [address].   IMMUNOHISTOCHEMISTRY DISCLAIMER (if  applicable): Some of these immunohistochemical stains may have been developed and the performance characteristics determine by [facility]. Some may not have been cleared or approved by the U.S. Food and Drug Administration. The FDA has determined that such clearance or approval is not necessary. This test is used for clinical purposes. It should not be regarded as investigational or for research. This laboratory is  certified under the [place] (CLIA-88) as qualified to perform high complexity clinical laboratory testing.  The controls stained appropriately.       Assessment & Plan:   Problem List Items Addressed This Visit   None    Follow up plan: No follow-ups on file.

## 2022-11-10 ENCOUNTER — Encounter: Payer: Self-pay | Admitting: Nurse Practitioner

## 2022-11-10 ENCOUNTER — Other Ambulatory Visit: Payer: Self-pay

## 2022-11-10 ENCOUNTER — Ambulatory Visit (INDEPENDENT_AMBULATORY_CARE_PROVIDER_SITE_OTHER): Payer: 59 | Admitting: Nurse Practitioner

## 2022-11-10 VITALS — BP 120/72 | HR 92 | Temp 98.1°F | Resp 18 | Ht 71.0 in | Wt 250.6 lb

## 2022-11-10 DIAGNOSIS — E6609 Other obesity due to excess calories: Secondary | ICD-10-CM

## 2022-11-10 DIAGNOSIS — Z131 Encounter for screening for diabetes mellitus: Secondary | ICD-10-CM | POA: Diagnosis not present

## 2022-11-10 DIAGNOSIS — Z114 Encounter for screening for human immunodeficiency virus [HIV]: Secondary | ICD-10-CM | POA: Diagnosis not present

## 2022-11-10 DIAGNOSIS — Z130 Encounter for screening for diseases of the blood and blood-forming organs and certain disorders involving the immune mechanism: Secondary | ICD-10-CM | POA: Diagnosis not present

## 2022-11-10 DIAGNOSIS — R7989 Other specified abnormal findings of blood chemistry: Secondary | ICD-10-CM | POA: Diagnosis not present

## 2022-11-10 DIAGNOSIS — Z6834 Body mass index (BMI) 34.0-34.9, adult: Secondary | ICD-10-CM

## 2022-11-10 DIAGNOSIS — Z7689 Persons encountering health services in other specified circumstances: Secondary | ICD-10-CM

## 2022-11-10 DIAGNOSIS — F330 Major depressive disorder, recurrent, mild: Secondary | ICD-10-CM

## 2022-11-10 DIAGNOSIS — Z803 Family history of malignant neoplasm of breast: Secondary | ICD-10-CM | POA: Diagnosis not present

## 2022-11-10 DIAGNOSIS — F419 Anxiety disorder, unspecified: Secondary | ICD-10-CM

## 2022-11-10 DIAGNOSIS — Z13220 Encounter for screening for lipoid disorders: Secondary | ICD-10-CM | POA: Diagnosis not present

## 2022-11-10 DIAGNOSIS — Z1159 Encounter for screening for other viral diseases: Secondary | ICD-10-CM

## 2022-11-10 DIAGNOSIS — Z1231 Encounter for screening mammogram for malignant neoplasm of breast: Secondary | ICD-10-CM | POA: Diagnosis not present

## 2022-11-10 DIAGNOSIS — Z9884 Bariatric surgery status: Secondary | ICD-10-CM

## 2022-11-10 DIAGNOSIS — F902 Attention-deficit hyperactivity disorder, combined type: Secondary | ICD-10-CM | POA: Diagnosis not present

## 2022-11-10 MED ORDER — LISDEXAMFETAMINE DIMESYLATE 60 MG PO CAPS
60.0000 mg | ORAL_CAPSULE | ORAL | 0 refills | Status: DC
  Filled 2023-02-06: qty 30, 30d supply, fill #0

## 2022-11-10 MED ORDER — LISDEXAMFETAMINE DIMESYLATE 60 MG PO CAPS
60.0000 mg | ORAL_CAPSULE | ORAL | 0 refills | Status: DC
  Filled 2022-12-07: qty 30, 30d supply, fill #0

## 2022-11-10 MED ORDER — LISDEXAMFETAMINE DIMESYLATE 60 MG PO CAPS
60.0000 mg | ORAL_CAPSULE | Freq: Every day | ORAL | 0 refills | Status: DC
  Filled 2023-01-05: qty 30, 30d supply, fill #0

## 2022-11-10 MED ORDER — BUPROPION HCL ER (XL) 150 MG PO TB24
150.0000 mg | ORAL_TABLET | Freq: Every day | ORAL | 1 refills | Status: DC
  Filled 2022-11-10 – 2022-11-21 (×2): qty 90, 90d supply, fill #0
  Filled 2023-03-05: qty 90, 90d supply, fill #1

## 2022-11-10 NOTE — Assessment & Plan Note (Signed)
Had gastric sleeve, continues to work on lifestyle changes, doing well

## 2022-11-10 NOTE — Assessment & Plan Note (Signed)
Continue taking wellbutrin 150 mg daily and vyvanse 60 mg daily

## 2022-11-10 NOTE — Assessment & Plan Note (Signed)
Continue taking wellbutrin 150 mg daily

## 2022-11-10 NOTE — Assessment & Plan Note (Signed)
No symptoms, getting labs

## 2022-11-10 NOTE — Assessment & Plan Note (Signed)
Doing well 

## 2022-11-11 LAB — COMPLETE METABOLIC PANEL WITH GFR
AG Ratio: 1.9 (calc) (ref 1.0–2.5)
ALT: 16 U/L (ref 6–29)
AST: 19 U/L (ref 10–30)
Albumin: 4.2 g/dL (ref 3.6–5.1)
Alkaline phosphatase (APISO): 53 U/L (ref 31–125)
BUN: 15 mg/dL (ref 7–25)
CO2: 25 mmol/L (ref 20–32)
Calcium: 9.2 mg/dL (ref 8.6–10.2)
Chloride: 107 mmol/L (ref 98–110)
Creat: 0.81 mg/dL (ref 0.50–0.97)
Globulin: 2.2 g/dL (calc) (ref 1.9–3.7)
Glucose, Bld: 81 mg/dL (ref 65–99)
Potassium: 5 mmol/L (ref 3.5–5.3)
Sodium: 140 mmol/L (ref 135–146)
Total Bilirubin: 0.5 mg/dL (ref 0.2–1.2)
Total Protein: 6.4 g/dL (ref 6.1–8.1)
eGFR: 97 mL/min/{1.73_m2} (ref >=60)

## 2022-11-11 LAB — THYROID PANEL WITH TSH
Free Thyroxine Index: 2.4 (ref 1.4–3.8)
T3 Uptake: 33 % (ref 22–35)
T4, Total: 7.3 ug/dL (ref 5.1–11.9)
TSH: 0.5 mIU/L

## 2022-11-11 LAB — CBC WITH DIFFERENTIAL/PLATELET
Absolute Monocytes: 480 cells/uL (ref 200–950)
Basophils Absolute: 38 cells/uL (ref 0–200)
Basophils Relative: 0.6 %
Eosinophils Absolute: 109 cells/uL (ref 15–500)
Eosinophils Relative: 1.7 %
HCT: 41 % (ref 35.0–45.0)
Hemoglobin: 13.3 g/dL (ref 11.7–15.5)
Lymphs Abs: 1478 cells/uL (ref 850–3900)
MCH: 29.5 pg (ref 27.0–33.0)
MCHC: 32.4 g/dL (ref 32.0–36.0)
MCV: 90.9 fL (ref 80.0–100.0)
MPV: 9.3 fL (ref 7.5–12.5)
Monocytes Relative: 7.5 %
Neutro Abs: 4294 cells/uL (ref 1500–7800)
Neutrophils Relative %: 67.1 %
Platelets: 271 10*3/uL (ref 140–400)
RBC: 4.51 10*6/uL (ref 3.80–5.10)
RDW: 12.2 % (ref 11.0–15.0)
Total Lymphocyte: 23.1 %
WBC: 6.4 10*3/uL (ref 3.8–10.8)

## 2022-11-11 LAB — HEMOGLOBIN A1C
Hgb A1c MFr Bld: 5 % of total Hgb (ref <5.7)
Mean Plasma Glucose: 97 mg/dL
eAG (mmol/L): 5.4 mmol/L

## 2022-11-11 LAB — LIPID PANEL
Cholesterol: 135 mg/dL (ref <200)
HDL: 54 mg/dL (ref >=50)
LDL Cholesterol (Calc): 71 mg/dL (calc)
Non-HDL Cholesterol (Calc): 81 mg/dL (calc) (ref <130)
Total CHOL/HDL Ratio: 2.5 (calc) (ref <5.0)
Triglycerides: 35 mg/dL (ref <150)

## 2022-11-11 LAB — HIV ANTIBODY (ROUTINE TESTING W REFLEX): HIV 1&2 Ab, 4th Generation: NONREACTIVE

## 2022-11-11 LAB — HEPATITIS C ANTIBODY: Hepatitis C Ab: NONREACTIVE

## 2022-11-21 ENCOUNTER — Other Ambulatory Visit: Payer: Self-pay

## 2022-12-01 ENCOUNTER — Other Ambulatory Visit: Payer: Self-pay

## 2022-12-05 ENCOUNTER — Other Ambulatory Visit: Payer: Self-pay

## 2022-12-06 ENCOUNTER — Other Ambulatory Visit: Payer: Self-pay

## 2022-12-07 ENCOUNTER — Other Ambulatory Visit: Payer: Self-pay

## 2022-12-20 ENCOUNTER — Ambulatory Visit
Admission: RE | Admit: 2022-12-20 | Discharge: 2022-12-20 | Disposition: A | Discharge status: Home / Self Care | Payer: 59 | Source: Ambulatory Visit | Attending: Nurse Practitioner | Admitting: Nurse Practitioner

## 2022-12-20 DIAGNOSIS — Z1231 Encounter for screening mammogram for malignant neoplasm of breast: Secondary | ICD-10-CM | POA: Insufficient documentation

## 2022-12-20 DIAGNOSIS — Z803 Family history of malignant neoplasm of breast: Secondary | ICD-10-CM | POA: Insufficient documentation

## 2022-12-29 ENCOUNTER — Telehealth: Payer: 59 | Admitting: Physician Assistant

## 2022-12-29 ENCOUNTER — Other Ambulatory Visit: Payer: Self-pay

## 2022-12-29 DIAGNOSIS — B9689 Other specified bacterial agents as the cause of diseases classified elsewhere: Secondary | ICD-10-CM

## 2022-12-29 DIAGNOSIS — J019 Acute sinusitis, unspecified: Secondary | ICD-10-CM | POA: Diagnosis not present

## 2022-12-29 MED ORDER — DOXYCYCLINE HYCLATE 100 MG PO TABS
100.0000 mg | ORAL_TABLET | Freq: Two times a day (BID) | ORAL | 0 refills | Status: DC
  Filled 2022-12-29: qty 20, 10d supply, fill #0

## 2022-12-29 NOTE — Progress Notes (Signed)

## 2022-12-29 NOTE — Progress Notes (Signed)
I have spent 5 minutes in review of e-visit questionnaire, review and updating patient chart, medical decision making and response to patient.   Natassia Guthridge Cody Taliah Porche, PA-C    

## 2022-12-30 ENCOUNTER — Other Ambulatory Visit: Payer: Self-pay

## 2023-01-04 ENCOUNTER — Other Ambulatory Visit: Payer: Self-pay

## 2023-01-05 ENCOUNTER — Other Ambulatory Visit: Payer: Self-pay

## 2023-02-06 ENCOUNTER — Other Ambulatory Visit: Payer: Self-pay

## 2023-02-14 ENCOUNTER — Encounter (HOSPITAL_COMMUNITY): Payer: Self-pay | Admitting: *Deleted

## 2023-03-03 NOTE — Progress Notes (Unsigned)
   There were no vitals taken for this visit.   Subjective:    Patient ID: Lauren Macias, female    DOB: 1986/06/14, 37 y.o.   MRN: [id number]  HPI: Lauren Macias is a 37 y.o. female  No chief complaint on file.  Obesity:  Current weight : *** BMI: *** Treatment Tried: gastric sleeve in 2022 Comorbidities: ADHD, depression, anxiety   Depression/anxiety/adhd Medication wellbutrin 150 mg daily, vyvanse 60 mg daily Compliant *** Side effects *** PHQ9 *** GAD *** She has had anxiety, depression, adhd since age 23.       11/10/2022    9:07 AM 03/11/2021    9:24 AM  Depression screen PHQ 2/9  Decreased Interest 1 0  Down, Depressed, Hopeless 1 1  PHQ - 2 Score 2 1  Altered sleeping 1   Tired, decreased energy 1   Change in appetite 1   Feeling bad or failure about yourself  1   Trouble concentrating 1   Moving slowly or fidgety/restless 1   Suicidal thoughts 0   PHQ-9 Score 8   Difficult doing work/chores Somewhat difficult        11/10/2022    9:08 AM  GAD 7 : Generalized Anxiety Score  Nervous, Anxious, on Edge 2  Control/stop worrying 1  Worry too much - different things 2  Trouble relaxing 1  Restless 1  Easily annoyed or irritable 1  Afraid - awful might happen 1  Total GAD 7 Score 9  Anxiety Difficulty Somewhat difficult    Relevant past medical, surgical, family and social history reviewed and updated as indicated. Interim medical history since our last visit reviewed. Allergies and medications reviewed and updated.  Review of Systems  Constitutional: Negative for fever or weight change.  Respiratory: Negative for cough and shortness of breath.   Cardiovascular: Negative for chest pain or palpitations.  Gastrointestinal: Negative for abdominal pain, no bowel changes.  Musculoskeletal: Negative for gait problem or joint swelling.  Skin: Negative for rash.  Neurological: Negative for dizziness or headache.  No other specific complaints in a  complete review of systems (except as listed in HPI above).      Objective:    There were no vitals taken for this visit.  Wt Readings from Last 3 Encounters:  11/10/22 250 lb 9.6 oz (113.7 kg)  09/10/21 297 lb 11.2 oz (135 kg)  07/21/21 (!) 323 lb 11.2 oz (146.8 kg)    Physical Exam  Constitutional: Patient appears well-developed and well-nourished. Obese  No distress.  HEENT: head atraumatic, normocephalic, pupils equal and reactive to light, neck supple Cardiovascular: Normal rate, regular rhythm and normal heart sounds.  No murmur heard. No BLE edema. Pulmonary/Chest: Effort normal and breath sounds normal. No respiratory distress. Abdominal: Soft.  There is no tenderness. Psychiatric: Patient has a normal mood and affect. behavior is normal. Judgment and thought content normal.      Assessment & Plan:   Problem List Items Addressed This Visit   None    Follow up plan: No follow-ups on file.

## 2023-03-05 ENCOUNTER — Other Ambulatory Visit: Payer: Self-pay | Admitting: Nurse Practitioner

## 2023-03-05 ENCOUNTER — Other Ambulatory Visit: Payer: Self-pay

## 2023-03-05 DIAGNOSIS — F902 Attention-deficit hyperactivity disorder, combined type: Secondary | ICD-10-CM

## 2023-03-06 ENCOUNTER — Other Ambulatory Visit: Payer: Self-pay

## 2023-03-06 ENCOUNTER — Ambulatory Visit (INDEPENDENT_AMBULATORY_CARE_PROVIDER_SITE_OTHER): Payer: 59 | Admitting: Nurse Practitioner

## 2023-03-06 ENCOUNTER — Encounter: Payer: Self-pay | Admitting: Nurse Practitioner

## 2023-03-06 VITALS — BP 118/72 | HR 83 | Temp 98.0°F | Resp 18 | Ht 71.0 in | Wt 260.3 lb

## 2023-03-06 DIAGNOSIS — Z6834 Body mass index (BMI) 34.0-34.9, adult: Secondary | ICD-10-CM

## 2023-03-06 DIAGNOSIS — F419 Anxiety disorder, unspecified: Secondary | ICD-10-CM

## 2023-03-06 DIAGNOSIS — F902 Attention-deficit hyperactivity disorder, combined type: Secondary | ICD-10-CM

## 2023-03-06 DIAGNOSIS — F330 Major depressive disorder, recurrent, mild: Secondary | ICD-10-CM | POA: Diagnosis not present

## 2023-03-06 DIAGNOSIS — E6609 Other obesity due to excess calories: Secondary | ICD-10-CM | POA: Diagnosis not present

## 2023-03-06 MED ORDER — SEMAGLUTIDE-WEIGHT MANAGEMENT 0.25 MG/0.5ML ~~LOC~~ SOAJ
0.2500 mg | SUBCUTANEOUS | 0 refills | Status: DC
Start: 2023-03-06 — End: 2023-03-07
  Filled 2023-03-06 (×2): qty 2, 28d supply, fill #0

## 2023-03-06 MED ORDER — LISDEXAMFETAMINE DIMESYLATE 60 MG PO CAPS
60.0000 mg | ORAL_CAPSULE | ORAL | 0 refills | Status: DC
Start: 2023-05-05 — End: 2023-04-28

## 2023-03-06 MED ORDER — LISDEXAMFETAMINE DIMESYLATE 60 MG PO CAPS
60.0000 mg | ORAL_CAPSULE | ORAL | 0 refills | Status: DC
Start: 2023-04-05 — End: 2023-04-28

## 2023-03-06 MED ORDER — LISDEXAMFETAMINE DIMESYLATE 60 MG PO CAPS
60.0000 mg | ORAL_CAPSULE | Freq: Every day | ORAL | 0 refills | Status: DC
Start: 2023-03-06 — End: 2023-03-07
  Filled 2023-03-06 – 2023-03-07 (×2): qty 30, 30d supply, fill #0

## 2023-03-06 NOTE — Assessment & Plan Note (Signed)
Continue taking vyvanse 60 mg daily. Patient reports that this is working for her continue with current treatment plan.

## 2023-03-06 NOTE — Assessment & Plan Note (Signed)
Continue wellbutrin 150 mg daily 

## 2023-03-06 NOTE — Assessment & Plan Note (Signed)
Try and get patient approved for wegovy.  Also discussed [facility]

## 2023-03-07 ENCOUNTER — Other Ambulatory Visit: Payer: Self-pay | Admitting: Nurse Practitioner

## 2023-03-07 ENCOUNTER — Other Ambulatory Visit: Payer: Self-pay

## 2023-03-07 DIAGNOSIS — F330 Major depressive disorder, recurrent, mild: Secondary | ICD-10-CM

## 2023-03-07 DIAGNOSIS — F902 Attention-deficit hyperactivity disorder, combined type: Secondary | ICD-10-CM

## 2023-03-07 DIAGNOSIS — E6609 Other obesity due to excess calories: Secondary | ICD-10-CM

## 2023-03-07 DIAGNOSIS — F419 Anxiety disorder, unspecified: Secondary | ICD-10-CM

## 2023-03-07 NOTE — Telephone Encounter (Signed)
Medication Refill - Medication:  lisdexamfetamine (VYVANSE) 60 MG capsule buPROPion (WELLBUTRIN XL) 150 MG 24 hr tablet  Semaglutide-Weight Management 0.25 MG/0.5ML SOAJ  Has the patient contacted their pharmacy? Yes.   Pt is needing her medications transferred to a different pharmacy. Pt new insurance will not cover her medications at her original pharmacy.   Preferred Pharmacy (with phone number or street name):  [facility] [place], [place] - [address] Phone: [phone]  Fax: [phone]     Has the patient been seen for an appointment in the last year OR does the patient have an upcoming appointment? Yes.    Agent: Please be advised that RX refills may take up to 3 business days. We ask that you follow-up with your pharmacy.

## 2023-03-07 NOTE — Telephone Encounter (Signed)
Requested medication (s) are due for refill today:yes  Requested medication (s) are on the active medication list: yes  Last refill:  03/06/23 #30  Future visit scheduled: yes Notes to clinic:  med is at pharmacy just waiting to have pt get new insurance information and given to pharmacy--NT not delegated to refuse this refill request   Requested Prescriptions  Pending Prescriptions Disp Refills   lisdexamfetamine (VYVANSE) 60 MG capsule 30 capsule 0    Sig: Take 1 capsule (60 mg total) by mouth every morning.     Not Delegated - Psychiatry:  Stimulants/ADHD Failed - 03/05/2023  8:15 PM      Failed - This refill cannot be delegated      Failed - Urine Drug Screen completed in last 360 days      Passed - Last BP in normal range    BP Readings from Last 1 Encounters:  03/06/23 118/72         Passed - Last Heart Rate in normal range    Pulse Readings from Last 1 Encounters:  03/06/23 83         Passed - Valid encounter within last 6 months    Recent Outpatient Visits           Yesterday Class 1 obesity due to excess calories without serious comorbidity with body mass index (BMI) of 34.0 to 34.9 in adult   [facility] Berniece Salines, FNP   3 months ago Attention deficit hyperactivity disorder (ADHD), combined type   [facility] Berniece Salines, FNP       Future Appointments             In 3 months Zane Herald, Rudolpho Sevin, FNP [facility], [facility]

## 2023-03-07 NOTE — Telephone Encounter (Signed)
Resend to another pharmacy

## 2023-03-08 ENCOUNTER — Other Ambulatory Visit: Payer: Self-pay

## 2023-03-08 MED ORDER — LISDEXAMFETAMINE DIMESYLATE 60 MG PO CAPS: 60.0000 mg | ORAL_CAPSULE | Freq: Every day | ORAL | 0 refills | Status: DC

## 2023-03-08 MED ORDER — BUPROPION HCL ER (XL) 150 MG PO TB24
150.0000 mg | ORAL_TABLET | Freq: Every day | ORAL | 0 refills | Status: DC
Start: 2023-03-08 — End: 2023-07-13

## 2023-03-08 MED ORDER — SEMAGLUTIDE-WEIGHT MANAGEMENT 0.25 MG/0.5ML ~~LOC~~ SOAJ
0.2500 mg | SUBCUTANEOUS | 0 refills | Status: AC
Start: 2023-03-08 — End: 2023-04-05

## 2023-03-08 NOTE — Telephone Encounter (Signed)
Requested medication (s) are due for refill today: yes, resend to another pharmacy  Requested medication (s) are on the active medication list: yes  Last refill:  03/06/23  Future visit scheduled: yes  Notes to clinic:  Unable to refill per protocol, cannot delegate.      Requested Prescriptions  Pending Prescriptions Disp Refills   lisdexamfetamine (VYVANSE) 60 MG capsule 30 capsule 0    Sig: Take 1 capsule (60 mg total) by mouth daily.     Not Delegated - Psychiatry:  Stimulants/ADHD Failed - 03/07/2023  3:43 PM      Failed - This refill cannot be delegated      Failed - Urine Drug Screen completed in last 360 days      Passed - Last BP in normal range    BP Readings from Last 1 Encounters:  03/06/23 118/72         Passed - Last Heart Rate in normal range    Pulse Readings from Last 1 Encounters:  03/06/23 83         Passed - Valid encounter within last 6 months    Recent Outpatient Visits           2 days ago Class 1 obesity due to excess calories without serious comorbidity with body mass index (BMI) of 34.0 to 34.9 in adult   [facility] Berniece Salines, FNP   3 months ago Attention deficit hyperactivity disorder (ADHD), combined type   [facility] Berniece Salines, FNP       Future Appointments             In 3 months Berniece Salines, FNP [facility], [facility]            Signed Prescriptions Disp Refills   buPROPion (WELLBUTRIN XL) 150 MG 24 hr tablet 90 tablet 0    Sig: Take 1 tablet (150 mg total) by mouth daily.     Psychiatry: Antidepressants - bupropion Passed - 03/07/2023  3:43 PM      Passed - Cr in normal range and within 360 days    Creat  Date Value Ref Range Status  11/10/2022 0.81 0.50 - 0.97 mg/dL Final         Passed - AST in normal range and within 360 days    AST  Date Value Ref Range Status  11/10/2022 19 10 - 30 U/L Final   SGOT(AST)  Date Value Ref  Range Status  01/06/2013 32 15 - 37 Unit/L Final         Passed - ALT in normal range and within 360 days    ALT  Date Value Ref Range Status  11/10/2022 16 6 - 29 U/L Final   SGPT (ALT)  Date Value Ref Range Status  01/06/2013 26 12 - 78 U/L Final         Passed - Completed PHQ-2 or PHQ-9 in the last 360 days      Passed - Last BP in normal range    BP Readings from Last 1 Encounters:  03/06/23 118/72         Passed - Valid encounter within last 6 months    Recent Outpatient Visits           2 days ago Class 1 obesity due to excess calories without serious comorbidity with body mass index (BMI) of 34.0 to 34.9 in adult   [facility] Berniece Salines, FNP   3 months ago Attention deficit hyperactivity disorder (ADHD), combined type   [facility] Berniece Salines, FNP       Future Appointments             In 3 months Zane Herald, Rudolpho Sevin, FNP [facility], [facility]             Semaglutide-Weight Management 0.25 MG/0.5ML SOAJ 2 mL 0    Sig: Inject 0.25 mg into the skin once a week for 28 days.     Endocrinology:  Diabetes - GLP-1 Receptor Agonists - semaglutide Passed - 03/07/2023  3:43 PM      Passed - HBA1C in normal range and within 180 days    Hgb A1c MFr Bld  Date Value Ref Range Status  11/10/2022 5.0 <5.7 % of total Hgb Final    Comment:    For the purpose of screening for the presence of diabetes: . <5.7%       Consistent with the absence of diabetes 5.7-6.4%    Consistent with increased risk for diabetes             (prediabetes) > or =6.5%  Consistent with diabetes . This assay result is consistent with a decreased risk of diabetes. . Currently, no consensus exists regarding use of hemoglobin A1c for diagnosis of diabetes in children. . According to American Diabetes Association (ADA) guidelines, hemoglobin A1c <7.0% represents optimal control in non-pregnant diabetic  patients. Different metrics may apply to specific patient populations.  Standards of Medical Care in Diabetes(ADA). .          Passed - Cr in normal range and within 360 days    Creat  Date Value Ref Range Status  11/10/2022 0.81 0.50 - 0.97 mg/dL Final         Passed - Valid encounter within last 6 months    Recent Outpatient Visits           2 days ago Class 1 obesity due to excess calories without serious comorbidity with body mass index (BMI) of 34.0 to 34.9 in adult   [facility] Berniece Salines, FNP   3 months ago Attention deficit hyperactivity disorder (ADHD), combined type   [facility] Berniece Salines, FNP       Future Appointments             In 3 months Zane Herald, Rudolpho Sevin, FNP [facility], [facility]

## 2023-03-08 NOTE — Telephone Encounter (Signed)
Unable to refuse 

## 2023-03-08 NOTE — Telephone Encounter (Signed)
Resending to [facility] pharmacy.  Requested Prescriptions  Pending Prescriptions Disp Refills   lisdexamfetamine (VYVANSE) 60 MG capsule 30 capsule 0    Sig: Take 1 capsule (60 mg total) by mouth daily.     Not Delegated - Psychiatry:  Stimulants/ADHD Failed - 03/07/2023  3:43 PM      Failed - This refill cannot be delegated      Failed - Urine Drug Screen completed in last 360 days      Passed - Last BP in normal range    BP Readings from Last 1 Encounters:  03/06/23 118/72         Passed - Last Heart Rate in normal range    Pulse Readings from Last 1 Encounters:  03/06/23 83         Passed - Valid encounter within last 6 months    Recent Outpatient Visits           2 days ago Class 1 obesity due to excess calories without serious comorbidity with body mass index (BMI) of 34.0 to 34.9 in adult   [facility] Berniece Salines, FNP   3 months ago Attention deficit hyperactivity disorder (ADHD), combined type   [facility] Berniece Salines, FNP       Future Appointments             In 3 months Zane Herald, Rudolpho Sevin, FNP [facility], [facility]             buPROPion (WELLBUTRIN XL) 150 MG 24 hr tablet 90 tablet 0    Sig: Take 1 tablet (150 mg total) by mouth daily.     Psychiatry: Antidepressants - bupropion Passed - 03/07/2023  3:43 PM      Passed - Cr in normal range and within 360 days    Creat  Date Value Ref Range Status  11/10/2022 0.81 0.50 - 0.97 mg/dL Final         Passed - AST in normal range and within 360 days    AST  Date Value Ref Range Status  11/10/2022 19 10 - 30 U/L Final   SGOT(AST)  Date Value Ref Range Status  01/06/2013 32 15 - 37 Unit/L Final         Passed - ALT in normal range and within 360 days    ALT  Date Value Ref Range Status  11/10/2022 16 6 - 29 U/L Final   SGPT (ALT)  Date Value Ref Range Status  01/06/2013 26 12 - 78 U/L Final         Passed - Completed  PHQ-2 or PHQ-9 in the last 360 days      Passed - Last BP in normal range    BP Readings from Last 1 Encounters:  03/06/23 118/72         Passed - Valid encounter within last 6 months    Recent Outpatient Visits           2 days ago Class 1 obesity due to excess calories without serious comorbidity with body mass index (BMI) of 34.0 to 34.9 in adult   [facility] Berniece Salines, FNP   3 months ago Attention deficit hyperactivity disorder (ADHD), combined type   [facility] Berniece Salines, FNP       Future Appointments             In 3 months  Berniece Salines, FNP [facility], [facility]             Semaglutide-Weight Management 0.25 MG/0.5ML SOAJ 2 mL 0    Sig: Inject 0.25 mg into the skin once a week for 28 days.     Endocrinology:  Diabetes - GLP-1 Receptor Agonists - semaglutide Passed - 03/07/2023  3:43 PM      Passed - HBA1C in normal range and within 180 days    Hgb A1c MFr Bld  Date Value Ref Range Status  11/10/2022 5.0 <5.7 % of total Hgb Final    Comment:    For the purpose of screening for the presence of diabetes: . <5.7%       Consistent with the absence of diabetes 5.7-6.4%    Consistent with increased risk for diabetes             (prediabetes) > or =6.5%  Consistent with diabetes . This assay result is consistent with a decreased risk of diabetes. . Currently, no consensus exists regarding use of hemoglobin A1c for diagnosis of diabetes in children. . According to American Diabetes Association (ADA) guidelines, hemoglobin A1c <7.0% represents optimal control in non-pregnant diabetic patients. Different metrics may apply to specific patient populations.  Standards of Medical Care in Diabetes(ADA). .          Passed - Cr in normal range and within 360 days    Creat  Date Value Ref Range Status  11/10/2022 0.81 0.50 - 0.97 mg/dL Final         Passed - Valid  encounter within last 6 months    Recent Outpatient Visits           2 days ago Class 1 obesity due to excess calories without serious comorbidity with body mass index (BMI) of 34.0 to 34.9 in adult   [facility] Berniece Salines, FNP   3 months ago Attention deficit hyperactivity disorder (ADHD), combined type   [facility] Berniece Salines, FNP       Future Appointments             In 3 months Zane Herald, Rudolpho Sevin, FNP [facility], [facility]

## 2023-03-10 ENCOUNTER — Other Ambulatory Visit: Payer: Self-pay

## 2023-03-13 ENCOUNTER — Ambulatory Visit: Payer: 59 | Admitting: Nurse Practitioner

## 2023-03-17 ENCOUNTER — Telehealth: Payer: Self-pay | Admitting: Nurse Practitioner

## 2023-03-17 NOTE — Telephone Encounter (Signed)
Paperwork faxed °

## 2023-03-17 NOTE — Telephone Encounter (Signed)
Pt is calling in to check the status on a Prior Authorization for [employer]. Pt says she has been waiting two weeks for the authorization. Pt is requesting a call back from someone regarding this.  [phone]

## 2023-03-21 ENCOUNTER — Other Ambulatory Visit: Payer: Self-pay | Admitting: Nurse Practitioner

## 2023-03-21 ENCOUNTER — Encounter (INDEPENDENT_AMBULATORY_CARE_PROVIDER_SITE_OTHER): Payer: 59 | Admitting: Family Medicine

## 2023-03-21 ENCOUNTER — Ambulatory Visit: Payer: Self-pay

## 2023-03-21 DIAGNOSIS — Z207 Contact with and (suspected) exposure to pediculosis, acariasis and other infestations: Secondary | ICD-10-CM

## 2023-03-21 MED ORDER — PERMETHRIN 5 % EX CREA: TOPICAL_CREAM | CUTANEOUS | 1 refills | Status: DC

## 2023-03-21 NOTE — Telephone Encounter (Signed)
Please send script to pharmacy. Do you need to see her

## 2023-03-21 NOTE — Telephone Encounter (Signed)
FYI

## 2023-03-21 NOTE — Telephone Encounter (Signed)
Cancel appointment please

## 2023-03-21 NOTE — Telephone Encounter (Signed)
Per nurse Myriam Jacobson, appt cancelled

## 2023-03-21 NOTE — Telephone Encounter (Signed)
  Chief Complaint: Scabies exposure - anxiety from exposure Symptoms: none Frequency:  Pertinent Negatives: Patient denies any s/s other than anxiety  Disposition: [] ED /[] Urgent Care (no appt availability in office) / [x] Appointment(In office/virtual)/ []  [facility]/ [] Home Care/ [] Refused Recommended Disposition /[] [facility]/ []  Follow-up with PCP Additional Notes: Pt was working with a pt which is believed to have scabies. Our pt is very anxious about this and would like some preventative treatment.  VV scheduled.  Reason for Disposition  [1] EXPOSURE (close contact) to person with known scabies AND [2] has not been treated  Answer Assessment - Initial Assessment Questions 1. PLACE OF EXPOSURE: "Where were you when you were exposed?" (e.g., home, work)     Last 2 days 2. TYPE OF EXPOSURE: "How were you exposed?" "How much contact was there?"     Took BP and removed food tray 3. DATE OF EXPOSURE: "When did the exposure occur?" (e.g., days)     Last 2 days 4. SYMPTOMS: "Do you have any symptoms?" (e.g., itching, bumpy rash)  If Yes, ask: "What does it look like?" "Which parts of your body are affected?"     no 5. PRIOR HISTORY: "Have you ever had scabies before?"     no 6. CLOSE CONTACTS: "Does any person who lives with you or has had close contact with you have itching?"     Pt that she worked with the past 2 days.  Protocols used: Scabies Exposure-A-AH

## 2023-03-22 ENCOUNTER — Telehealth: Payer: 59 | Admitting: Nurse Practitioner

## 2023-03-23 NOTE — Progress Notes (Signed)
Attempted to connect to video visit after Haiku noted pt was ready but by the time I got Epic up on computer the appt was cancelled

## 2023-04-16 ENCOUNTER — Other Ambulatory Visit: Payer: Self-pay | Admitting: Nurse Practitioner

## 2023-04-16 DIAGNOSIS — F902 Attention-deficit hyperactivity disorder, combined type: Secondary | ICD-10-CM

## 2023-04-18 NOTE — Telephone Encounter (Signed)
Requested medications are due for refill today.  no  Requested medications are on the active medications list.  yes  Last refill. 04/05/2023 #30 0 rf  Future visit scheduled.   yes  Notes to clinic.  Refill not delegated. Pt has an upcoming rx already placed to start on 05/05/2023.    Requested Prescriptions  Pending Prescriptions Disp Refills   lisdexamfetamine (VYVANSE) 60 MG capsule [Pharmacy Med Name: lisdexamfetamine 60 mg capsule (VYVANSE)] 30 capsule 0    Sig: Take 1 capsule (60 mg total) by mouth daily.     Not Delegated - Psychiatry:  Stimulants/ADHD Failed - 04/16/2023 11:43 AM      Failed - This refill cannot be delegated      Failed - Urine Drug Screen completed in last 360 days      Passed - Last BP in normal range    BP Readings from Last 1 Encounters:  03/06/23 118/72         Passed - Last Heart Rate in normal range    Pulse Readings from Last 1 Encounters:  03/06/23 83         Passed - Valid encounter within last 6 months    Recent Outpatient Visits           1 month ago Class 1 obesity due to excess calories without serious comorbidity with body mass index (BMI) of 34.0 to 34.9 in adult   [facility] Berniece Salines, FNP   5 months ago Attention deficit hyperactivity disorder (ADHD), combined type   [facility] Berniece Salines, FNP       Future Appointments             In 1 month Zane Herald, Rudolpho Sevin, FNP [facility], [facility]

## 2023-04-22 ENCOUNTER — Other Ambulatory Visit (HOSPITAL_COMMUNITY): Payer: Self-pay

## 2023-04-26 ENCOUNTER — Other Ambulatory Visit: Payer: Self-pay | Admitting: Nurse Practitioner

## 2023-04-26 DIAGNOSIS — F902 Attention-deficit hyperactivity disorder, combined type: Secondary | ICD-10-CM

## 2023-04-26 NOTE — Telephone Encounter (Signed)
Requested medication (s) are due for refill today: Yes  Requested medication (s) are on the active medication list: Yes  Last refill:  03/06/23  Future visit scheduled: Yes  Notes to clinic:  Unable to refill per protocol, cannot delegate. Patient did not pick up on 04/05/23 and is now back in school and needs the medication sent to a different pharmacy.      Requested Prescriptions  Pending Prescriptions Disp Refills   lisdexamfetamine (VYVANSE) 60 MG capsule 30 capsule 0    Sig: Take 1 capsule (60 mg total) by mouth every morning.     Not Delegated - Psychiatry:  Stimulants/ADHD Failed - 04/26/2023  3:09 PM      Failed - This refill cannot be delegated      Failed - Urine Drug Screen completed in last 360 days      Passed - Last BP in normal range    BP Readings from Last 1 Encounters:  03/06/23 118/72         Passed - Last Heart Rate in normal range    Pulse Readings from Last 1 Encounters:  03/06/23 83         Passed - Valid encounter within last 6 months    Recent Outpatient Visits           1 month ago Class 1 obesity due to excess calories without serious comorbidity with body mass index (BMI) of 34.0 to 34.9 in adult   [facility] Berniece Salines, FNP   5 months ago Attention deficit hyperactivity disorder (ADHD), combined type   [facility] Berniece Salines, FNP       Future Appointments             In 1 month Zane Herald, Rudolpho Sevin, FNP [facility], [facility]

## 2023-04-26 NOTE — Telephone Encounter (Signed)
Medication Refill - Medication: lisdexamfetamine (VYVANSE) 60 MG capsule   Pt is out of medication.   Has the patient contacted their pharmacy? Yes.   Refill was sent to [facility]. Per pt her insurance does not cover her medication if it is not sent to [facility].     Preferred Pharmacy (with phone number or street name): [facility] [place], [place] - [address] [place] [address] Phone: [phone]  Fax: [phone]  Has the patient been seen for an appointment in the last year OR does the patient have an upcoming appointment? Yes.    Agent: Please be advised that RX refills may take up to 3 business days. We ask that you follow-up with your pharmacy.

## 2023-04-28 ENCOUNTER — Other Ambulatory Visit: Payer: Self-pay

## 2023-04-28 ENCOUNTER — Telehealth: Payer: Self-pay | Admitting: Emergency Medicine

## 2023-04-28 ENCOUNTER — Other Ambulatory Visit: Payer: Self-pay | Admitting: Nurse Practitioner

## 2023-04-28 DIAGNOSIS — F902 Attention-deficit hyperactivity disorder, combined type: Secondary | ICD-10-CM

## 2023-04-28 MED ORDER — LISDEXAMFETAMINE DIMESYLATE 60 MG PO CAPS
60.0000 mg | ORAL_CAPSULE | Freq: Every day | ORAL | 0 refills | Status: DC
Start: 2023-05-28 — End: 2023-07-14

## 2023-04-28 MED ORDER — LISDEXAMFETAMINE DIMESYLATE 60 MG PO CAPS: 60.0000 mg | ORAL_CAPSULE | ORAL | 0 refills | Status: DC

## 2023-04-28 NOTE — Telephone Encounter (Signed)
Pt notified rx was sent today to [facility].   Copied from CRM [id number]. Topic: General - Other >> Apr 28, 2023  7:36 AM Everette C wrote: Reason for CRM: The patient has called to follow up on their previous refill request of Rx #: [id number]  lisdexamfetamine (VYVANSE) 60 MG capsule [[id number]]   The patient is uncertain of the reason for the delay/denial of their previous request   Please contact further when possible >> Apr 28, 2023  2:14 PM RMA Leanna Sato wrote: New scripts sent to pharmacy >> Apr 28, 2023 10:31 AM RMA Leanna Sato wrote: Spoke to patient. Need scriptse sent to different pharmacy. I called [facility] and cancelled out all old scripts

## 2023-06-06 NOTE — Progress Notes (Deleted)
There were no vitals taken for this visit.   Subjective:    Patient ID: Lauren Macias, female    DOB: 07-16-1986, 37 y.o.   MRN: [id number]  HPI: Lauren Macias is a 37 y.o. female  No chief complaint on file.  Obesity:  Current weight : 260 lbs BMI: 36.30 Treatment Tried: gastric sleeve in 2022 Comorbidities: ADHD, depression, anxiety  She would like to try and get approved for [employer].    Depression/anxiety/adhd Medication wellbutrin 150 mg daily, vyvanse 60 mg daily Compliant yes Side effects none PHQ9 positive GAD positive She has had anxiety, depression, adhd since age 78.   PDMP reviewed    03/21/2023    8:19 AM 03/06/2023    8:15 AM 11/10/2022    9:07 AM 03/11/2021    9:24 AM  Depression screen PHQ 2/9  Decreased Interest 0 2 1 0  Down, Depressed, Hopeless 0 2 1 1   PHQ - 2 Score 0 4 2 1   Altered sleeping 0 0 1   Tired, decreased energy 0 1 1   Change in appetite 0 0 1   Feeling bad or failure about yourself  0 2 1   Trouble concentrating 0 2 1   Moving slowly or fidgety/restless 0 1 1   Suicidal thoughts 0 1 0   PHQ-9 Score 0 11 8   Difficult doing work/chores Not difficult at all Somewhat difficult Somewhat difficult        03/06/2023    8:15 AM 11/10/2022    9:08 AM  GAD 7 : Generalized Anxiety Score  Nervous, Anxious, on Edge 1 2  Control/stop worrying 1 1  Worry too much - different things 1 2  Trouble relaxing 0 1  Restless 0 1  Easily annoyed or irritable 1 1  Afraid - awful might happen 1 1  Total GAD 7 Score 5 9  Anxiety Difficulty Somewhat difficult Somewhat difficult    Relevant past medical, surgical, family and social history reviewed and updated as indicated. Interim medical history since our last visit reviewed. Allergies and medications reviewed and updated.  Review of Systems  Constitutional: Negative for fever or weight change.  Respiratory: Negative for cough and shortness of breath.   Cardiovascular: Negative for chest  pain or palpitations.  Gastrointestinal: Negative for abdominal pain, no bowel changes.  Musculoskeletal: Negative for gait problem or joint swelling.  Skin: Negative for rash.  Neurological: Negative for dizziness or headache.  No other specific complaints in a complete review of systems (except as listed in HPI above).      Objective:    There were no vitals taken for this visit.  Wt Readings from Last 3 Encounters:  03/06/23 260 lb 4.8 oz (118.1 kg)  11/10/22 250 lb 9.6 oz (113.7 kg)  09/10/21 297 lb 11.2 oz (135 kg)    Physical Exam  Constitutional: Patient appears well-developed and well-nourished. Obese  No distress.  HEENT: head atraumatic, normocephalic, pupils equal and reactive to light, neck supple Cardiovascular: Normal rate, regular rhythm and normal heart sounds.  No murmur heard. No BLE edema. Pulmonary/Chest: Effort normal and breath sounds normal. No respiratory distress. Abdominal: Soft.  There is no tenderness. Psychiatric: Patient has a normal mood and affect. behavior is normal. Judgment and thought content normal.      Assessment & Plan:   Problem List Items Addressed This Visit   None     Follow up plan: No follow-ups on file.

## 2023-06-07 ENCOUNTER — Ambulatory Visit: Payer: 59 | Admitting: Nurse Practitioner

## 2023-06-14 IMAGING — CT CT HEAD W/O CM
3 series · 15 of 47 positions shown, 18 images · non-contrast
Comparison: October 25, 2012.

CLINICAL DATA: Numbness and tingling with paresthesias of the lips
LEFT arm and leg. 35-year-old female.

EXAM:
CT HEAD WITHOUT CONTRAST
TECHNIQUE: Contiguous axial images were obtained from the base of the skull
through the vertex without intravenous contrast.

[Series 3: head wo · axial · 0.43mm/px · z∈[-143,-18]mm · 9 of 31 slices shown, 12 images]
[im 3/31  brain]
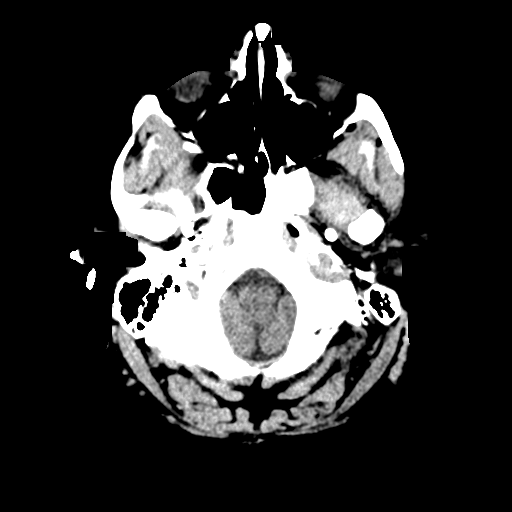
[im 3/31  bone]
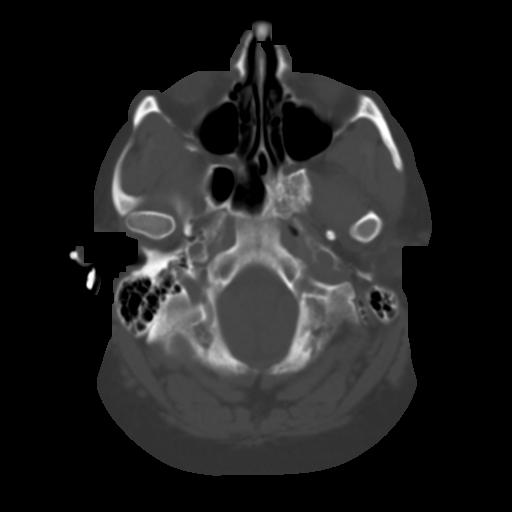
[im 6/31  brain]
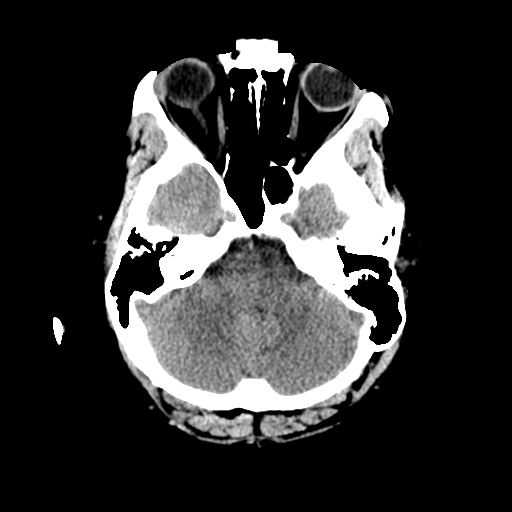
[im 9/31  brain]
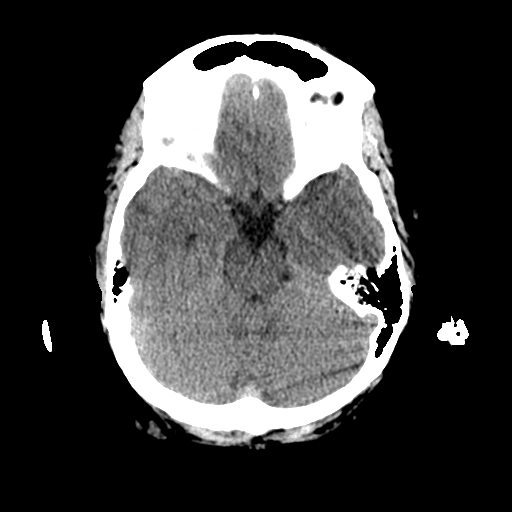
[im 12/31  brain]
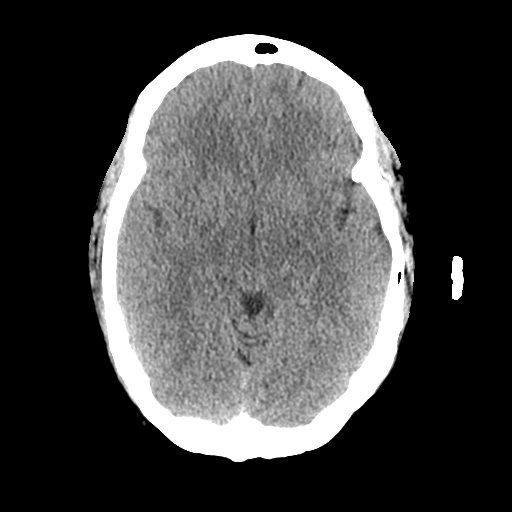
[im 16/31  brain]
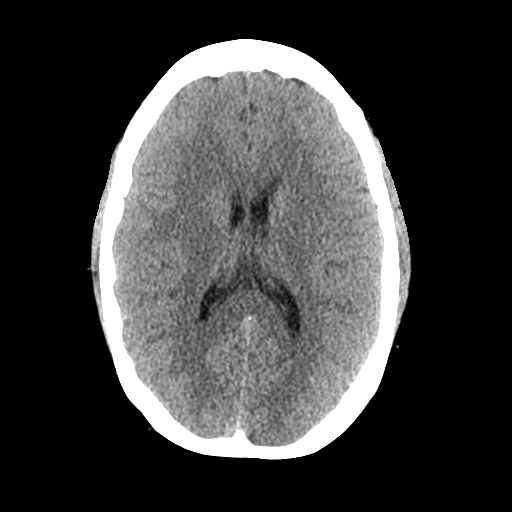
[im 16/31  bone]
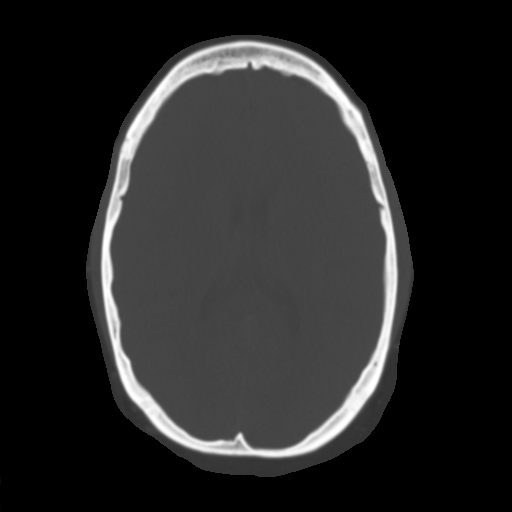
[im 19/31  brain]
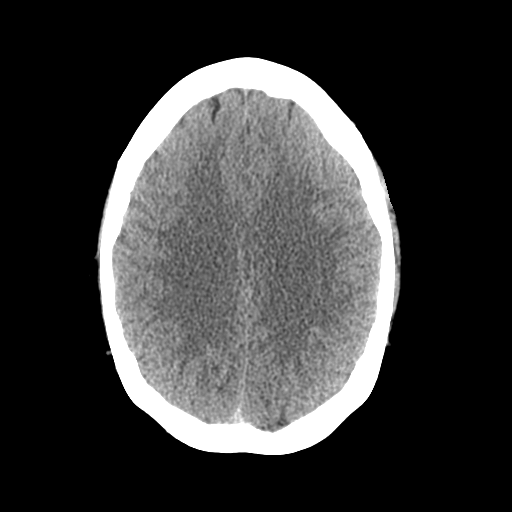
[im 22/31  brain]
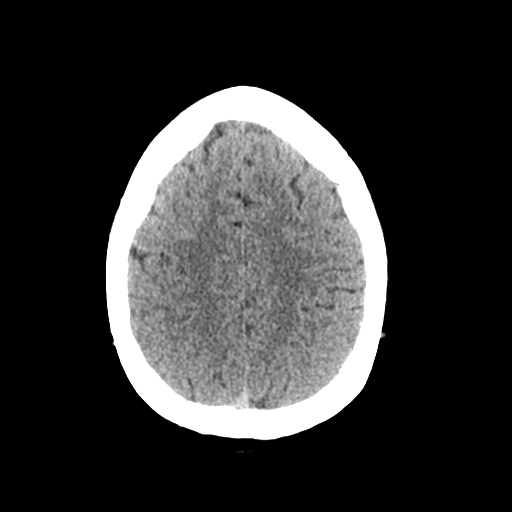
[im 25/31  brain]
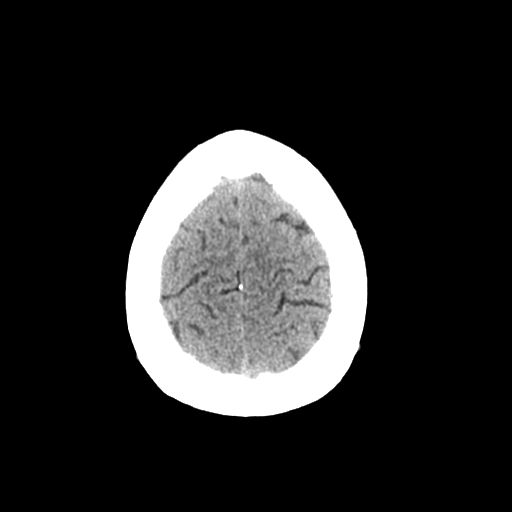
[im 28/31  brain]
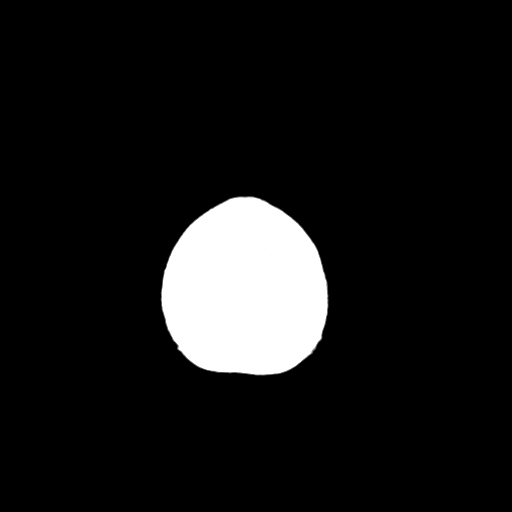
[im 28/31  bone]
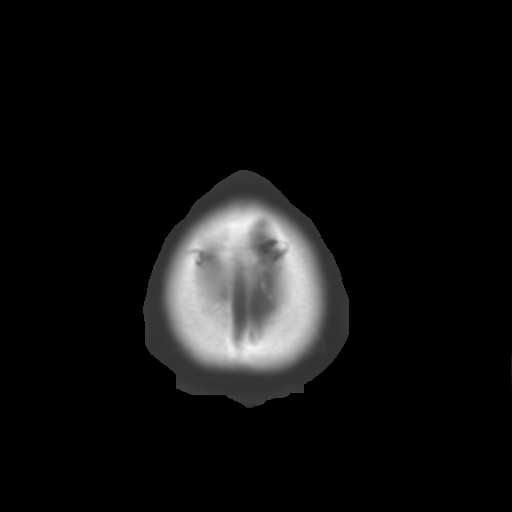

[Series 4: coronal soft tissue · coronal · 0.31mm/px · 3 of 70 slices shown]
[im 24/70  brain]
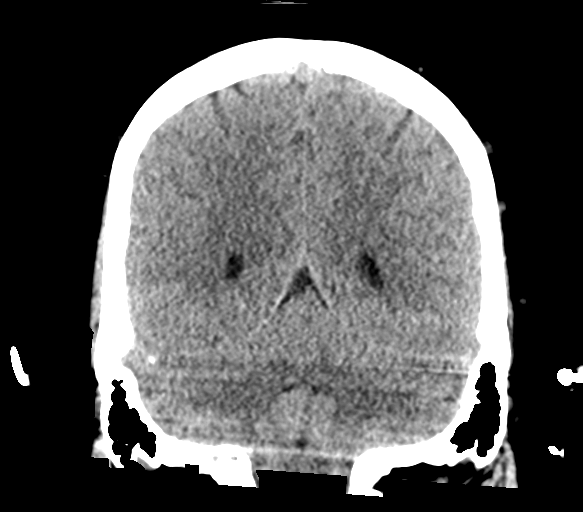
[im 31/70  brain]
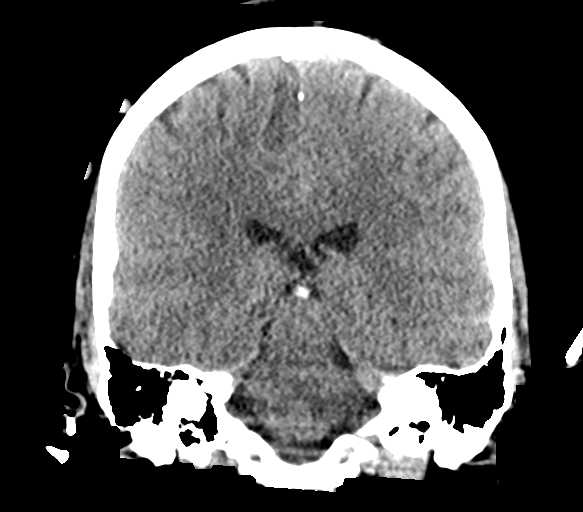
[im 39/70  brain]
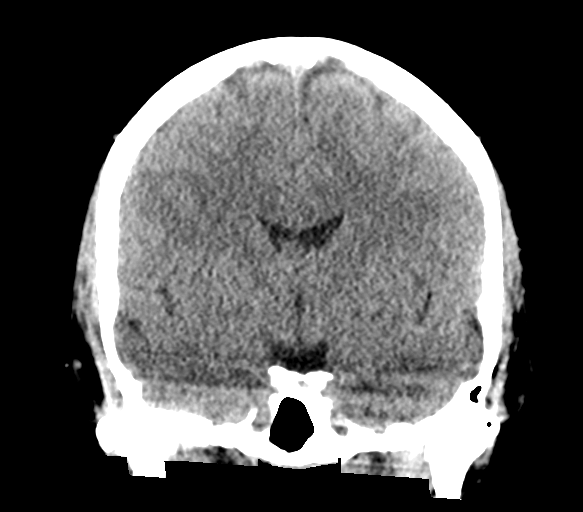

[Series 5: sagittal soft tissue · sagittal · 0.31mm/px · 3 of 61 slices shown]
[im 21/61  brain]
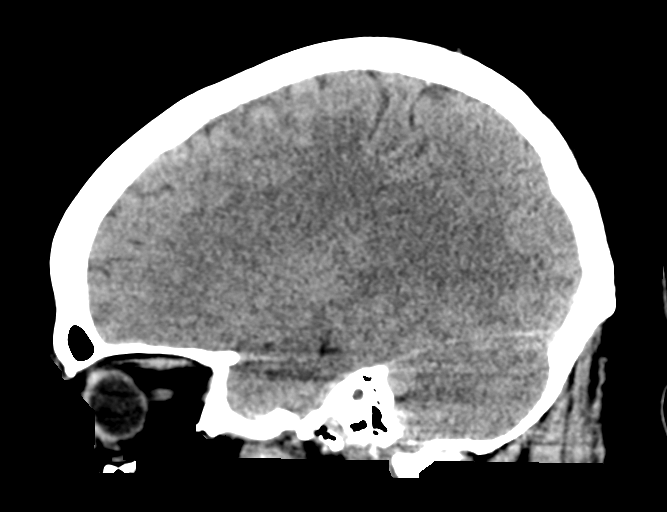
[im 31/61  brain]
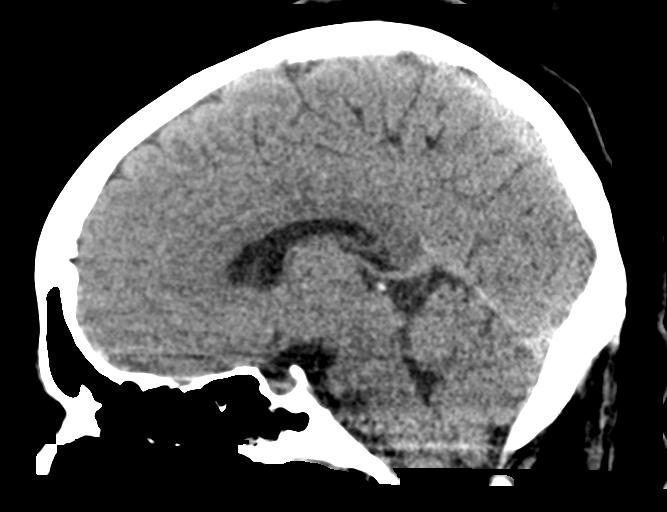
[im 41/61  brain]
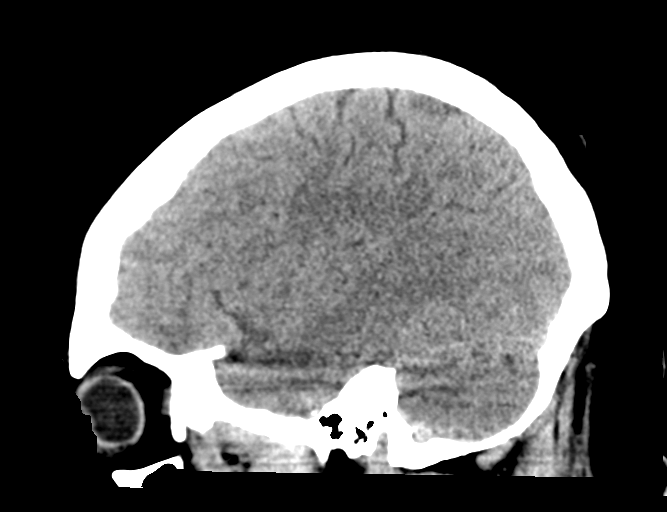

[15 of 47 positions shown; findings below may reference images not displayed]

FINDINGS: Brain: No evidence of acute infarction, hemorrhage, hydrocephalus,
extra-axial collection or mass lesion/mass effect.

Vascular: No hyperdense vessel or unexpected calcification.

Skull: Normal. Negative for fracture or focal lesion.

Sinuses/Orbits: Frothy secretions in the LEFT sphenoid sinus,
diminished mucosal thickening in this area compared to prior
imaging. Mucous retention cyst or polyp partially imaged in the LEFT
maxillary sinus. No acute orbital findings. Sinuses are incompletely
imaged.

Other: None.
IMPRESSION: 1. No acute intracranial abnormality.
2. Mild sphenoid sinusitis, improved compared to prior imaging.

## 2023-07-12 ENCOUNTER — Other Ambulatory Visit: Payer: Self-pay | Admitting: Nurse Practitioner

## 2023-07-12 DIAGNOSIS — F330 Major depressive disorder, recurrent, mild: Secondary | ICD-10-CM

## 2023-07-12 DIAGNOSIS — F902 Attention-deficit hyperactivity disorder, combined type: Secondary | ICD-10-CM

## 2023-07-12 DIAGNOSIS — F419 Anxiety disorder, unspecified: Secondary | ICD-10-CM

## 2023-07-13 NOTE — Telephone Encounter (Signed)
Requested Prescriptions  Pending Prescriptions Disp Refills   buPROPion (WELLBUTRIN XL) 150 MG 24 hr tablet [Pharmacy Med Name: buPROPion HCL XL 150 mg 24 hr tablet, extended release (Wellbutrin XL)] 90 tablet 0    Sig: Take 1 tablet (150 mg total) by mouth daily.     Psychiatry: Antidepressants - bupropion Passed - 07/12/2023 10:37 PM      Passed - Cr in normal range and within 360 days    Creat  Date Value Ref Range Status  11/10/2022 0.81 0.50 - 0.97 mg/dL Final         Passed - AST in normal range and within 360 days    AST  Date Value Ref Range Status  11/10/2022 19 10 - 30 U/L Final   SGOT(AST)  Date Value Ref Range Status  01/06/2013 32 15 - 37 Unit/L Final         Passed - ALT in normal range and within 360 days    ALT  Date Value Ref Range Status  11/10/2022 16 6 - 29 U/L Final   SGPT (ALT)  Date Value Ref Range Status  01/06/2013 26 12 - 78 U/L Final         Passed - Completed PHQ-2 or PHQ-9 in the last 360 days      Passed - Last BP in normal range    BP Readings from Last 1 Encounters:  03/06/23 118/72         Passed - Valid encounter within last 6 months    Recent Outpatient Visits           4 months ago Class 1 obesity due to excess calories without serious comorbidity with body mass index (BMI) of 34.0 to 34.9 in adult   [facility] Berniece Salines, FNP   8 months ago Attention deficit hyperactivity disorder (ADHD), combined type   [facility] Berniece Salines, FNP       Future Appointments             Tomorrow Zane Herald, Rudolpho Sevin, FNP [facility], [facility]

## 2023-07-14 ENCOUNTER — Other Ambulatory Visit: Payer: Self-pay

## 2023-07-14 ENCOUNTER — Ambulatory Visit: Payer: BC Managed Care – PPO | Admitting: Nurse Practitioner

## 2023-07-14 ENCOUNTER — Encounter: Payer: Self-pay | Admitting: Nurse Practitioner

## 2023-07-14 VITALS — BP 120/76 | HR 79 | Temp 98.1°F | Resp 18 | Ht 71.0 in | Wt 274.6 lb

## 2023-07-14 DIAGNOSIS — F902 Attention-deficit hyperactivity disorder, combined type: Secondary | ICD-10-CM | POA: Diagnosis not present

## 2023-07-14 DIAGNOSIS — F419 Anxiety disorder, unspecified: Secondary | ICD-10-CM | POA: Diagnosis not present

## 2023-07-14 DIAGNOSIS — K5900 Constipation, unspecified: Secondary | ICD-10-CM

## 2023-07-14 DIAGNOSIS — K921 Melena: Secondary | ICD-10-CM

## 2023-07-14 DIAGNOSIS — E6609 Other obesity due to excess calories: Secondary | ICD-10-CM

## 2023-07-14 DIAGNOSIS — E66811 Obesity, class 1: Secondary | ICD-10-CM

## 2023-07-14 DIAGNOSIS — F330 Major depressive disorder, recurrent, mild: Secondary | ICD-10-CM | POA: Diagnosis not present

## 2023-07-14 DIAGNOSIS — N920 Excessive and frequent menstruation with regular cycle: Secondary | ICD-10-CM

## 2023-07-14 DIAGNOSIS — Z6834 Body mass index (BMI) 34.0-34.9, adult: Secondary | ICD-10-CM

## 2023-07-14 DIAGNOSIS — R197 Diarrhea, unspecified: Secondary | ICD-10-CM

## 2023-07-14 MED ORDER — LISDEXAMFETAMINE DIMESYLATE 60 MG PO CAPS
60.0000 mg | ORAL_CAPSULE | Freq: Every day | ORAL | 0 refills | Status: DC
Start: 2023-08-12 — End: 2023-10-13

## 2023-07-14 MED ORDER — LISDEXAMFETAMINE DIMESYLATE 60 MG PO CAPS
60.0000 mg | ORAL_CAPSULE | ORAL | 0 refills | Status: DC
Start: 2023-09-11 — End: 2023-10-13

## 2023-07-14 MED ORDER — LISDEXAMFETAMINE DIMESYLATE 60 MG PO CAPS
60.0000 mg | ORAL_CAPSULE | ORAL | 0 refills | Status: DC
Start: 2023-07-14 — End: 2023-10-13

## 2023-07-14 MED ORDER — BUPROPION HCL ER (XL) 300 MG PO TB24
300.0000 mg | ORAL_TABLET | Freq: Every day | ORAL | 1 refills | Status: DC
Start: 2023-07-14 — End: 2024-01-11

## 2023-07-14 NOTE — Assessment & Plan Note (Signed)
Refill of vyvanse sent in. Doing well on current treatment.

## 2023-07-14 NOTE — Assessment & Plan Note (Signed)
Increase wellbutrin to 300 mg daily

## 2023-07-14 NOTE — Progress Notes (Signed)
BP 120/76   Pulse 79   Temp 98.1 F (36.7 C) (Oral)   Resp 18   Ht 5\' 11"  (1.803 m)   Wt 274 lb 9.6 oz (124.6 kg)   LMP 07/14/2023   SpO2 98%   BMI 38.30 kg/m    Subjective:    Patient ID: Lauren Macias, female    DOB: 01-15-1986, 37 y.o.   MRN: [id number]  HPI: Lauren Macias is a 37 y.o. female  Chief Complaint  Patient presents with   Medical Management of Chronic Issues   Obesity:  Current weight : 274 lbs BMI: 38.30 previous weight:260 lbs Treatment Tried: gastric sleeve 2022, tried to get approved for wegovy but was denied Comorbidities: ADHD, depression, anxiety   Depression/anxiety/adhd; depression screening has increased, recommend increasing wellbutrin.  Patient agreeable to plan. Patient doing well on vyvanse continue current treatment Medication wellbutrin 150 mg daily, vyvanse 60 mg daily Compliant yes Side effects no PHQ9 positive GAD positive She has had anxiety, depression, adhd since age 27.   PDMP reviewed    07/14/2023   11:05 AM 03/21/2023    8:19 AM 03/06/2023    8:15 AM 11/10/2022    9:07 AM 03/11/2021    9:24 AM  Depression screen PHQ 2/9  Decreased Interest 2 0 2 1 0  Down, Depressed, Hopeless 2 0 2 1 1   PHQ - 2 Score 4 0 4 2 1   Altered sleeping 1 0 0 1   Tired, decreased energy 1 0 1 1   Change in appetite 1 0 0 1   Feeling bad or failure about yourself  3 0 2 1   Trouble concentrating 3 0 2 1   Moving slowly or fidgety/restless 0 0 1 1   Suicidal thoughts 0 0 1 0   PHQ-9 Score 13 0 11 8   Difficult doing work/chores Somewhat difficult Not difficult at all Somewhat difficult Somewhat difficult        07/14/2023   11:06 AM 03/06/2023    8:15 AM 11/10/2022    9:08 AM  GAD 7 : Generalized Anxiety Score  Nervous, Anxious, on Edge 1 1 2   Control/stop worrying 0 1 1  Worry too much - different things 1 1 2   Trouble relaxing 0 0 1  Restless 2 0 1  Easily annoyed or irritable 0 1 1  Afraid - awful might happen 0 1 1  Total  GAD 7 Score 4 5 9   Anxiety Difficulty Not difficult at all Somewhat difficult Somewhat difficult   Blood in stool: patient reports that for the last 2 months she has had constipation and diarrhea.  She also has noticed blood in her stool.  She says she just does not feel right. She denies any fever or pain.  Will get labs, stool culture and stool cards.    Menorrhagia:  patient reports she does have endometriosis and has had issues with her period in the past.  She says that she needs a new gyn. Will place a new referral.   Relevant past medical, surgical, family and social history reviewed and updated as indicated. Interim medical history since our last visit reviewed. Allergies and medications reviewed and updated.  Review of Systems  Constitutional: Negative for fever or weight change.  Respiratory: Negative for cough and shortness of breath.   Cardiovascular: Negative for chest pain or palpitations.  Gastrointestinal: Negative for abdominal pain, bowel changes.  Musculoskeletal: Negative for gait problem or joint swelling.  Skin: Negative for rash.  Neurological: Negative for dizziness or headache.  No other specific complaints in a complete review of systems (except as listed in HPI above).      Objective:    BP 120/76   Pulse 79   Temp 98.1 F (36.7 C) (Oral)   Resp 18   Ht 5\' 11"  (1.803 m)   Wt 274 lb 9.6 oz (124.6 kg)   LMP 07/14/2023   SpO2 98%   BMI 38.30 kg/m   Wt Readings from Last 3 Encounters:  07/14/23 274 lb 9.6 oz (124.6 kg)  03/06/23 260 lb 4.8 oz (118.1 kg)  11/10/22 250 lb 9.6 oz (113.7 kg)    Physical Exam  Constitutional: Patient appears well-developed and well-nourished. Obese  No distress.  HEENT: head atraumatic, normocephalic, pupils equal and reactive to light, neck supple Cardiovascular: Normal rate, regular rhythm and normal heart sounds.  No murmur heard. No BLE edema. Pulmonary/Chest: Effort normal and breath sounds normal. No respiratory  distress. Abdominal: Soft.  There is no tenderness. Psychiatric: Patient has a normal mood and affect. behavior is normal. Judgment and thought content normal.      Assessment & Plan:   Problem List Items Addressed This Visit       Other   Anxiety    Increase wellbutrin to 300 mg daily      Relevant Medications   buPROPion (WELLBUTRIN XL) 300 MG 24 hr tablet   Obesity    Increased wellbutrin, continue working on lifestyle modification      Relevant Medications   lisdexamfetamine (VYVANSE) 60 MG capsule (Start on 08/12/2023)   lisdexamfetamine (VYVANSE) 60 MG capsule (Start on 09/11/2023)   lisdexamfetamine (VYVANSE) 60 MG capsule   Depression - Primary    Increase wellbutrin to 300 mg daily      Relevant Medications   buPROPion (WELLBUTRIN XL) 300 MG 24 hr tablet   Attention-deficit hyperactivity disorder, unspecified type    Refill of vyvanse sent in. Doing well on current treatment.       Relevant Medications   lisdexamfetamine (VYVANSE) 60 MG capsule (Start on 08/12/2023)   lisdexamfetamine (VYVANSE) 60 MG capsule (Start on 09/11/2023)   lisdexamfetamine (VYVANSE) 60 MG capsule   Other Visit Diagnoses     Blood in stool       getting labs, stool culture and stool cards,  consider referral to gi   Relevant Orders   CBC with Differential/Platelet   COMPLETE METABOLIC PANEL WITH GFR   Ova and parasite examination   CALPROTECTIN   Celiac Disease Panel   Salmonella/Shigella Cult, Campy EIA and Shiga Toxin reflex   Clostridium difficile culture-fecal   TSH   Diarrhea, unspecified type       getting labs, stool culture and stool cards, consider referral to gi   Relevant Orders   CBC with Differential/Platelet   COMPLETE METABOLIC PANEL WITH GFR   Ova and parasite examination   CALPROTECTIN   Celiac Disease Panel   Salmonella/Shigella Cult, Campy EIA and Shiga Toxin reflex   Clostridium difficile culture-fecal   TSH   Constipation, unspecified constipation type        getting labs, stool culture and stool cards, consider referral to gi   Relevant Orders   CBC with Differential/Platelet   COMPLETE METABOLIC PANEL WITH GFR   Ova and parasite examination   CALPROTECTIN   Celiac Disease Panel   Salmonella/Shigella Cult, Campy EIA and Shiga Toxin reflex   Clostridium difficile culture-fecal   TSH  Menorrhagia with regular cycle       getting labs, referral to gyn   Relevant Orders   CBC with Differential/Platelet   TSH   Ambulatory referral to Gynecology         Follow up plan: Return in about 3 months (around 10/14/2023) for follow up.

## 2023-07-14 NOTE — Assessment & Plan Note (Signed)
Increased wellbutrin, continue working on lifestyle modification

## 2023-07-17 LAB — CBC WITH DIFFERENTIAL/PLATELET
Absolute Lymphocytes: 1683 {cells}/uL (ref 850–3900)
Absolute Monocytes: 549 {cells}/uL (ref 200–950)
Basophils Absolute: 72 {cells}/uL (ref 0–200)
Basophils Relative: 0.8 %
Eosinophils Absolute: 225 {cells}/uL (ref 15–500)
Eosinophils Relative: 2.5 %
HCT: 41 % (ref 35.0–45.0)
Hemoglobin: 13.4 g/dL (ref 11.7–15.5)
MCH: 30.1 pg (ref 27.0–33.0)
MCHC: 32.7 g/dL (ref 32.0–36.0)
MCV: 92.1 fL (ref 80.0–100.0)
MPV: 8.8 fL (ref 7.5–12.5)
Monocytes Relative: 6.1 %
Neutro Abs: 6471 {cells}/uL (ref 1500–7800)
Neutrophils Relative %: 71.9 %
Platelets: 329 10*3/uL (ref 140–400)
RBC: 4.45 10*6/uL (ref 3.80–5.10)
RDW: 12 % (ref 11.0–15.0)
Total Lymphocyte: 18.7 %
WBC: 9 10*3/uL (ref 3.8–10.8)

## 2023-07-17 LAB — COMPLETE METABOLIC PANEL WITH GFR
AG Ratio: 1.8 (calc) (ref 1.0–2.5)
ALT: 29 U/L (ref 6–29)
AST: 26 U/L (ref 10–30)
Albumin: 4.2 g/dL (ref 3.6–5.1)
Alkaline phosphatase (APISO): 60 U/L (ref 31–125)
BUN/Creatinine Ratio: 29 (calc) — ABNORMAL HIGH (ref 6–22)
BUN: 26 mg/dL — ABNORMAL HIGH (ref 7–25)
CO2: 27 mmol/L (ref 20–32)
Calcium: 9.4 mg/dL (ref 8.6–10.2)
Chloride: 103 mmol/L (ref 98–110)
Creat: 0.89 mg/dL (ref 0.50–0.97)
Globulin: 2.4 g/dL (ref 1.9–3.7)
Glucose, Bld: 92 mg/dL (ref 65–99)
Potassium: 5.1 mmol/L (ref 3.5–5.3)
Sodium: 138 mmol/L (ref 135–146)
Total Bilirubin: 0.3 mg/dL (ref 0.2–1.2)
Total Protein: 6.6 g/dL (ref 6.1–8.1)
eGFR: 87 mL/min/{1.73_m2} (ref >=60)

## 2023-07-17 LAB — CELIAC DISEASE PANEL
(tTG) Ab, IgA: <1 U/mL
(tTG) Ab, IgG: <1 U/mL
Gliadin IgA: <1 U/mL
Gliadin IgG: <1 U/mL
Immunoglobulin A: 192 mg/dL (ref 47–310)

## 2023-07-17 LAB — TSH: TSH: 0.92 m[IU]/L

## 2023-07-18 ENCOUNTER — Ambulatory Visit (INDEPENDENT_AMBULATORY_CARE_PROVIDER_SITE_OTHER): Payer: BC Managed Care – PPO

## 2023-07-18 ENCOUNTER — Telehealth: Payer: Self-pay

## 2023-07-18 DIAGNOSIS — K921 Melena: Secondary | ICD-10-CM

## 2023-07-18 DIAGNOSIS — R197 Diarrhea, unspecified: Secondary | ICD-10-CM

## 2023-07-18 LAB — POC HEMOCCULT BLD/STL (HOME/3-CARD/SCREEN)
Card #2 Fecal Occult Blod, POC: POSITIVE
Card #3 Fecal Occult Blood, POC: POSITIVE
Fecal Occult Blood, POC: POSITIVE — AB

## 2023-07-18 NOTE — Telephone Encounter (Signed)
Patient dropped off her 3-disc stool card. All three samples were positive on different dates. Results have been entered.

## 2023-07-19 ENCOUNTER — Other Ambulatory Visit: Payer: Self-pay | Admitting: Nurse Practitioner

## 2023-07-19 ENCOUNTER — Encounter: Payer: Self-pay | Admitting: Nurse Practitioner

## 2023-07-19 DIAGNOSIS — E6609 Other obesity due to excess calories: Secondary | ICD-10-CM

## 2023-07-19 DIAGNOSIS — K921 Melena: Secondary | ICD-10-CM

## 2023-07-19 MED ORDER — WEGOVY 0.25 MG/0.5ML ~~LOC~~ SOAJ: 0.2500 mg | SUBCUTANEOUS | 0 refills | Status: DC

## 2023-07-19 NOTE — Progress Notes (Signed)
we

## 2023-07-28 LAB — SALMONELLA/SHIGELLA CULT, CAMPY EIA AND SHIGA TOXIN RFL ECOLI
MICRO NUMBER: 15720858
MICRO NUMBER:: 15720859
MICRO NUMBER:: 15720860
Result:: NOT DETECTED
SHIGA RESULT:: NOT DETECTED
SPECIMEN QUALITY: ADEQUATE
SPECIMEN QUALITY:: ADEQUATE
SPECIMEN QUALITY:: ADEQUATE

## 2023-07-28 LAB — OVA AND PARASITE EXAMINATION
CONCENTRATE RESULT:: NONE SEEN
MICRO NUMBER:: 15719461
SPECIMEN QUALITY:: ADEQUATE
TRICHROME RESULT:: NONE SEEN

## 2023-07-28 LAB — CALPROTECTIN: Calprotectin: 234 ug/g — ABNORMAL HIGH

## 2023-07-28 LAB — CLOSTRIDIUM DIFFICILE CULTURE-FECAL

## 2023-08-09 ENCOUNTER — Other Ambulatory Visit: Payer: Self-pay | Admitting: Nurse Practitioner

## 2023-08-09 DIAGNOSIS — F902 Attention-deficit hyperactivity disorder, combined type: Secondary | ICD-10-CM

## 2023-08-09 DIAGNOSIS — E66811 Obesity, class 1: Secondary | ICD-10-CM

## 2023-08-11 NOTE — Telephone Encounter (Signed)
Requested medication (s) are due for refill today no  Requested medication (s) are on the active medication list -yes  Future visit scheduled -yes  Last refill: 11/8,12/7,09/11/23  Notes to clinic: non delegated Rx  Requested Prescriptions  Pending Prescriptions Disp Refills   lisdexamfetamine (VYVANSE) 60 MG capsule [Pharmacy Med Name: lisdexamfetamine 60 mg capsule (VYVANSE)] 30 capsule 0    Sig: Take 1 capsule (60 mg total) by mouth every morning.     Not Delegated - Psychiatry:  Stimulants/ADHD Failed - 08/09/2023  3:54 PM      Failed - This refill cannot be delegated      Failed - Urine Drug Screen completed in last 360 days      Passed - Last BP in normal range    BP Readings from Last 1 Encounters:  07/14/23 120/76         Passed - Last Heart Rate in normal range    Pulse Readings from Last 1 Encounters:  07/14/23 79         Passed - Valid encounter within last 6 months    Recent Outpatient Visits           4 weeks ago Mild episode of recurrent major depressive disorder [facility])   [facility] Della Goo F, FNP   5 months ago Class 1 obesity due to excess calories without serious comorbidity with body mass index (BMI) of 34.0 to 34.9 in adult   [facility] Berniece Salines, FNP   9 months ago Attention deficit hyperactivity disorder (ADHD), combined type   [facility] Berniece Salines, FNP       Future Appointments             In 2 months Berniece Salines, FNP [facility], [facility]               Requested Prescriptions  Pending Prescriptions Disp Refills   lisdexamfetamine (VYVANSE) 60 MG capsule [Pharmacy Med Name: lisdexamfetamine 60 mg capsule (VYVANSE)] 30 capsule 0    Sig: Take 1 capsule (60 mg total) by mouth every morning.     Not Delegated - Psychiatry:  Stimulants/ADHD Failed - 08/09/2023  3:54 PM      Failed - This refill cannot be delegated       Failed - Urine Drug Screen completed in last 360 days      Passed - Last BP in normal range    BP Readings from Last 1 Encounters:  07/14/23 120/76         Passed - Last Heart Rate in normal range    Pulse Readings from Last 1 Encounters:  07/14/23 79         Passed - Valid encounter within last 6 months    Recent Outpatient Visits           4 weeks ago Mild episode of recurrent major depressive disorder [facility])   [facility] Della Goo F, FNP   5 months ago Class 1 obesity due to excess calories without serious comorbidity with body mass index (BMI) of 34.0 to 34.9 in adult   [facility] Berniece Salines, FNP   9 months ago Attention deficit hyperactivity disorder (ADHD), combined type   [facility] Berniece Salines, [place]       Future Appointments  In 2 months Zane Herald, Rudolpho Sevin, FNP [facility], [facility]

## 2023-08-11 NOTE — Telephone Encounter (Signed)
Requested Prescriptions  Pending Prescriptions Disp Refills   WEGOVY 0.25 MG/0.5ML SOAJ [Pharmacy Med Name: WEGOVY 0.25 MG/0.5 ML SUBCUTANEOUS PEN INJECTOR] 2 mL 0    Sig: Inject 0.25 mg under the skin every seven (7) days.     Endocrinology:  Diabetes - GLP-1 Receptor Agonists - semaglutide Failed - 08/09/2023  3:52 PM      Failed - HBA1C in normal range and within 180 days    Hgb A1c MFr Bld  Date Value Ref Range Status  11/10/2022 5.0 <5.7 % of total Hgb Final    Comment:    For the purpose of screening for the presence of diabetes: . <5.7%       Consistent with the absence of diabetes 5.7-6.4%    Consistent with increased risk for diabetes             (prediabetes) > or =6.5%  Consistent with diabetes . This assay result is consistent with a decreased risk of diabetes. . Currently, no consensus exists regarding use of hemoglobin A1c for diagnosis of diabetes in children. . According to American Diabetes Association (ADA) guidelines, hemoglobin A1c <7.0% represents optimal control in non-pregnant diabetic patients. Different metrics may apply to specific patient populations.  Standards of Medical Care in Diabetes(ADA). .          Passed - Cr in normal range and within 360 days    Creat  Date Value Ref Range Status  07/14/2023 0.89 0.50 - 0.97 mg/dL Final         Passed - Valid encounter within last 6 months    Recent Outpatient Visits           4 weeks ago Mild episode of recurrent major depressive disorder [facility])    [facility] Della Goo F, FNP   5 months ago Class 1 obesity due to excess calories without serious comorbidity with body mass index (BMI) of 34.0 to 34.9 in adult   [facility] Berniece Salines, FNP   9 months ago Attention deficit hyperactivity disorder (ADHD), combined type   [facility] Berniece Salines, FNP       Future Appointments             In 2 months  Zane Herald, Rudolpho Sevin, FNP [facility], [facility]

## 2023-10-13 ENCOUNTER — Telehealth: Payer: Self-pay | Admitting: Nurse Practitioner

## 2023-10-13 ENCOUNTER — Encounter: Payer: Self-pay | Admitting: Nurse Practitioner

## 2023-10-13 ENCOUNTER — Ambulatory Visit: Payer: BC Managed Care – PPO | Admitting: Nurse Practitioner

## 2023-10-13 VITALS — BP 124/84 | HR 87 | Temp 98.2°F | Wt 280.0 lb

## 2023-10-13 DIAGNOSIS — E66813 Obesity, class 3: Secondary | ICD-10-CM

## 2023-10-13 DIAGNOSIS — F330 Major depressive disorder, recurrent, mild: Secondary | ICD-10-CM | POA: Diagnosis not present

## 2023-10-13 DIAGNOSIS — F419 Anxiety disorder, unspecified: Secondary | ICD-10-CM | POA: Diagnosis not present

## 2023-10-13 DIAGNOSIS — F902 Attention-deficit hyperactivity disorder, combined type: Secondary | ICD-10-CM

## 2023-10-13 DIAGNOSIS — R195 Other fecal abnormalities: Secondary | ICD-10-CM

## 2023-10-13 DIAGNOSIS — Z6839 Body mass index (BMI) 39.0-39.9, adult: Secondary | ICD-10-CM

## 2023-10-13 DIAGNOSIS — K921 Melena: Secondary | ICD-10-CM

## 2023-10-13 DIAGNOSIS — E66811 Obesity, class 1: Secondary | ICD-10-CM

## 2023-10-13 MED ORDER — LISDEXAMFETAMINE DIMESYLATE 60 MG PO CAPS: 60.0000 mg | ORAL_CAPSULE | ORAL | 0 refills | Status: DC

## 2023-10-13 MED ORDER — WEGOVY 0.25 MG/0.5ML ~~LOC~~ SOAJ: 0.2500 mg | SUBCUTANEOUS | 0 refills | Status: DC

## 2023-10-13 MED ORDER — LISDEXAMFETAMINE DIMESYLATE 60 MG PO CAPS: 60.0000 mg | ORAL_CAPSULE | Freq: Every day | ORAL | 0 refills | Status: DC

## 2023-10-13 NOTE — Telephone Encounter (Signed)
 Per patient they will have in stock Monday and will just wait til then.

## 2023-10-13 NOTE — Progress Notes (Signed)
 BP 124/84   Pulse 87   Temp 98.2 F (36.8 C)   Wt 280 lb (127 kg)   SpO2 99%   BMI 39.05 kg/m    Subjective:    Patient ID: Lauren Macias, female    DOB: 30-Jun-1986, 38 y.o.   MRN: [id number]  HPI: Lauren Macias is a 38 y.o. female  Chief Complaint  Patient presents with   Depression    Discussed the use of AI scribe software for clinical note transcription with the patient, who gave verbal consent to proceed.  History of Present Illness   The patient, currently on Wellbutrin  300 mg daily, is considering participating in a PMDD study at [facility] due to experiencing 'real low lows' around her menstrual cycle. She has a history of being on various medications including Prozac, Zoloft , Lithium, and Latuda. Prozac was associated with a severe adverse reaction leading to a suicide attempt.  She is also on Vyvanse , which is effective, but she notices a decrease in its effectiveness in the afternoons, leading to restlessness and pacing. Her sleep has been disrupted, waking up at 2 or 3 in the morning for the past two weeks, which she believes is affecting her mental health. She is also struggling with weight fluctuations and binge eating when she is feeling particularly low.  Previously, she was on Wegovy  for weight loss, which helped her lose nine pounds, but she regained the weight during a period of sadness. She is interested in restarting this medication.  She also has a history of positive blood in stool and elevated calprotectin levels, indicating inflammation.       10/13/2023   10:13 AM 07/14/2023   11:05 AM 03/21/2023    8:19 AM  Depression screen PHQ 2/9  Decreased Interest 2 2 0  Down, Depressed, Hopeless 2 2 0  PHQ - 2 Score 4 4 0  Altered sleeping 2 1 0  Tired, decreased energy 2 1 0  Change in appetite 2 1 0  Feeling bad or failure about yourself  3 3 0  Trouble concentrating 3 3 0  Moving slowly or fidgety/restless 2 0 0  Suicidal thoughts 0 0 0  PHQ-9  Score 18 13 0  Difficult doing work/chores Somewhat difficult Somewhat difficult Not difficult at all       07/14/2023   11:06 AM 03/06/2023    8:15 AM 11/10/2022    9:08 AM  GAD 7 : Generalized Anxiety Score  Nervous, Anxious, on Edge 1 1 2   Control/stop worrying 0 1 1  Worry too much - different things 1 1 2   Trouble relaxing 0 0 1  Restless 2 0 1  Easily annoyed or irritable 0 1 1  Afraid - awful might happen 0 1 1  Total GAD 7 Score 4 5 9   Anxiety Difficulty Not difficult at all Somewhat difficult Somewhat difficult     Relevant past medical, surgical, family and social history reviewed and updated as indicated. Interim medical history since our last visit reviewed. Allergies and medications reviewed and updated.  Review of Systems  Constitutional: Negative for fever or weight change.  Respiratory: Negative for cough and shortness of breath.   Cardiovascular: Negative for chest pain or palpitations.  Gastrointestinal: Negative for abdominal pain, no bowel changes.  Musculoskeletal: Negative for gait problem or joint swelling.  Skin: Negative for rash.  Neurological: Negative for dizziness or headache.  No other specific complaints in a complete review of systems (except as listed  in HPI above).      Objective:    BP 124/84   Pulse 87   Temp 98.2 F (36.8 C)   Wt 280 lb (127 kg)   SpO2 99%   BMI 39.05 kg/m    Wt Readings from Last 3 Encounters:  10/13/23 280 lb (127 kg)  07/14/23 274 lb 9.6 oz (124.6 kg)  03/06/23 260 lb 4.8 oz (118.1 kg)    Physical Exam Vitals reviewed.  Constitutional:      Appearance: Normal appearance.  HENT:     Head: Normocephalic.  Cardiovascular:     Rate and Rhythm: Normal rate and regular rhythm.  Pulmonary:     Effort: Pulmonary effort is normal.     Breath sounds: Normal breath sounds.  Musculoskeletal:        General: Normal range of motion.  Skin:    General: Skin is warm and dry.  Neurological:     General: No focal  deficit present.     Mental Status: She is alert and oriented to person, place, and time. Mental status is at baseline.  Psychiatric:        Mood and Affect: Mood normal.        Behavior: Behavior normal.        Thought Content: Thought content normal.        Judgment: Judgment normal.     Results for orders placed or performed in visit on 07/18/23  POC Hemoccult Bld/Stl (3-Cd Home Screen)   Collection Time: 07/18/23  1:30 PM  Result Value Ref Range   Card #1 Date 07/15/2023    Fecal Occult Blood, POC Positive (A) Negative   Card #2 Date 07/16/2023    Card #2 Fecal Occult Blod, POC Positive    Card #3 Date 07/17/2023    Card #3 Fecal Occult Blood, POC Positive        Assessment & Plan:   Problem List Items Addressed This Visit       Other   Anxiety   Obesity - Primary   Relevant Medications   lisdexamfetamine (VYVANSE ) 60 MG capsule   lisdexamfetamine (VYVANSE ) 60 MG capsule (Start on 11/12/2023)   lisdexamfetamine (VYVANSE ) 60 MG capsule (Start on 12/12/2023)   Semaglutide -Weight Management (WEGOVY ) 0.25 MG/0.5ML SOAJ   Depression   Attention-deficit hyperactivity disorder, unspecified type   Relevant Medications   lisdexamfetamine (VYVANSE ) 60 MG capsule   lisdexamfetamine (VYVANSE ) 60 MG capsule (Start on 11/12/2023)   lisdexamfetamine (VYVANSE ) 60 MG capsule (Start on 12/12/2023)   Other Visit Diagnoses       High fecal calprotectin       Relevant Orders   Ambulatory referral to Gastroenterology     Blood in stool       Relevant Orders   Ambulatory referral to Gastroenterology        Assessment and Plan    Depression Currently on high dose Wellbutrin . History of adverse reactions to multiple other antidepressants including Prozac, Zoloft , Lithium, and Latuda. Patient considering participation [facility]. -Continue Wellbutrin . -Consider referral to psychiatry pending patient's decision about the PMDD study.  ADHD Vyvanse  appears to be effective,  but patient reports afternoon symptoms. -Continue Vyvanse .  Sleep disturbance Patient reports waking up at 2-3 AM for the past two weeks and sometimes not returning to sleep.   Weight fluctuation Patient reports difficulty losing weight and binge eating when feeling sad. Previous trial of Wegovy  was effective but discontinued. -Restart Wegovy , starting at 0.25mg  for  four weeks, then increasing as tolerated.  Gastrointestinal bleeding History of positive fecal occult blood test and elevated fecal calprotectin. Previous referral to [facility] gastroenterology not covered by insurance. -Send referral to [facility] gastroenterology.  General Health Maintenance -Refill Wellbutrin  and Vyvanse  prescriptions. -Follow-up regarding PMDD study and potential psychiatry referral.        Follow up plan: Return in about 3 months (around 01/10/2024) for follow up.

## 2023-10-13 NOTE — Telephone Encounter (Signed)
 John calling from [facility] is calling to report lisdexamfetamine (VYVANSE ) 60 MG capsule [[id number]] is out stock. Semaglutide -Weight Management (WEGOVY ) 0.25 MG/0.5ML SOAJ [[id number]] in need of a PA. [facility] line [phone] [address] [facility]

## 2023-10-19 ENCOUNTER — Telehealth: Payer: Self-pay | Admitting: Nurse Practitioner

## 2023-10-19 NOTE — Telephone Encounter (Signed)
Paperwork sent by cover my then faxed to [facility]

## 2023-10-19 NOTE — Telephone Encounter (Signed)
Pharmacy is needing Prior Authorization for medication:  Semaglutide-Weight Management (WEGOVY) 0.25 MG/0.5ML [facility] [place], [place] - [address]  Phone: [phone] Fax: [phone]

## 2023-10-24 NOTE — Telephone Encounter (Signed)
 Copied from CRM [id number]. Topic: General - Inquiry >> Oct 24, 2023  8:15 AM Clide Dales wrote: Frederic Jericho with [facility] called to get a status on the pa for patient's [facility] rx. Please advise.

## 2023-10-24 NOTE — Telephone Encounter (Signed)
 Left message for Lauren Macias to call office and informed us what additional information is needed

## 2023-11-14 ENCOUNTER — Telehealth: Payer: Self-pay

## 2023-11-14 NOTE — Telephone Encounter (Signed)
 Prior Auth from [employer] (WEGOVY) 0.25 MG/0.5ML SOAJ

## 2023-11-14 NOTE — Telephone Encounter (Signed)
 Prior Auth on   Semaglutide-Weight Management (WEGOVY) 0.25 MG/0.5ML SOAJ

## 2023-11-14 NOTE — Telephone Encounter (Signed)
 PA denied.

## 2024-01-10 ENCOUNTER — Ambulatory Visit: Payer: BC Managed Care – PPO | Admitting: Nurse Practitioner

## 2024-01-10 NOTE — Progress Notes (Deleted)
   There were no vitals taken for this visit.   Subjective:    Patient ID: Renard Carolin, female    DOB: 03/21/1986, 38 y.o.   MRN: [id number]  HPI: Glenyce Munns is a 38 y.o. female  No chief complaint on file.   Discussed the use of AI scribe software for clinical note transcription with the patient, who gave verbal consent to proceed.  History of Present Illness          10/13/2023   10:13 AM 07/14/2023   11:05 AM 03/21/2023    8:19 AM  Depression screen PHQ 2/9  Decreased Interest 2 2 0  Down, Depressed, Hopeless 2 2 0  PHQ - 2 Score 4 4 0  Altered sleeping 2 1 0  Tired, decreased energy 2 1 0  Change in appetite 2 1 0  Feeling bad or failure about yourself  3 3 0  Trouble concentrating 3 3 0  Moving slowly or fidgety/restless 2 0 0  Suicidal thoughts 0 0 0  PHQ-9 Score 18 13 0  Difficult doing work/chores Somewhat difficult Somewhat difficult Not difficult at all    Relevant past medical, surgical, family and social history reviewed and updated as indicated. Interim medical history since our last visit reviewed. Allergies and medications reviewed and updated.  Review of Systems  Per HPI unless specifically indicated above     Objective:    There were no vitals taken for this visit.  {Vitals History (Optional):23777} Wt Readings from Last 3 Encounters:  10/13/23 280 lb (127 kg)  07/14/23 274 lb 9.6 oz (124.6 kg)  03/06/23 260 lb 4.8 oz (118.1 kg)    Physical Exam Physical Exam    Results for orders placed or performed in visit on 07/18/23  POC Hemoccult Bld/Stl (3-Cd Home Screen)   Collection Time: 07/18/23  1:30 PM  Result Value Ref Range   Card #1 Date 07/15/2023    Fecal Occult Blood, POC Positive (A) Negative   Card #2 Date 07/16/2023    Card #2 Fecal Occult Blod, POC Positive    Card #3 Date 07/17/2023    Card #3 Fecal Occult Blood, POC Positive    {Labs (Optional):23779}    Assessment & Plan:   Problem List Items Addressed  This Visit   None    Assessment and Plan Assessment & Plan         Follow up plan: No follow-ups on file.

## 2024-01-11 ENCOUNTER — Ambulatory Visit: Admitting: Nurse Practitioner

## 2024-01-11 VITALS — BP 136/88 | HR 102 | Temp 98.1°F | Resp 18 | Ht 71.0 in | Wt 290.4 lb

## 2024-01-11 DIAGNOSIS — F419 Anxiety disorder, unspecified: Secondary | ICD-10-CM

## 2024-01-11 DIAGNOSIS — Z130 Encounter for screening for diseases of the blood and blood-forming organs and certain disorders involving the immune mechanism: Secondary | ICD-10-CM

## 2024-01-11 DIAGNOSIS — E66813 Obesity, class 3: Secondary | ICD-10-CM | POA: Diagnosis not present

## 2024-01-11 DIAGNOSIS — Z131 Encounter for screening for diabetes mellitus: Secondary | ICD-10-CM

## 2024-01-11 DIAGNOSIS — Z6841 Body Mass Index (BMI) 40.0 and over, adult: Secondary | ICD-10-CM

## 2024-01-11 DIAGNOSIS — F330 Major depressive disorder, recurrent, mild: Secondary | ICD-10-CM | POA: Diagnosis not present

## 2024-01-11 DIAGNOSIS — F902 Attention-deficit hyperactivity disorder, combined type: Secondary | ICD-10-CM | POA: Diagnosis not present

## 2024-01-11 DIAGNOSIS — Z13220 Encounter for screening for lipoid disorders: Secondary | ICD-10-CM

## 2024-01-11 MED ORDER — BUPROPION HCL ER (XL) 300 MG PO TB24: 300.0000 mg | ORAL_TABLET | Freq: Every day | ORAL | 1 refills | Status: AC

## 2024-01-11 MED ORDER — LISDEXAMFETAMINE DIMESYLATE 60 MG PO CAPS: 60.0000 mg | ORAL_CAPSULE | ORAL | 0 refills | Status: DC

## 2024-01-11 MED ORDER — LISDEXAMFETAMINE DIMESYLATE 60 MG PO CAPS: 60.0000 mg | ORAL_CAPSULE | ORAL | 0 refills | Status: AC

## 2024-01-11 MED ORDER — LISDEXAMFETAMINE DIMESYLATE 20 MG PO CAPS: ORAL_CAPSULE | ORAL | 0 refills | Status: AC

## 2024-01-11 MED ORDER — LISDEXAMFETAMINE DIMESYLATE 20 MG PO CAPS: ORAL_CAPSULE | ORAL | 0 refills | Status: DC

## 2024-01-11 MED ORDER — LISDEXAMFETAMINE DIMESYLATE 20 MG PO CAPS
ORAL_CAPSULE | ORAL | 0 refills | Status: DC
Start: 2024-02-10 — End: 2024-02-08

## 2024-01-11 NOTE — Progress Notes (Signed)
 BP 136/88   Pulse (!) 102   Temp 98.1 F (36.7 C)   Resp 18   Ht 5\' 11"  (1.803 m)   Wt 290 lb 6.4 oz (131.7 kg)   SpO2 97%   BMI 40.50 kg/m    Subjective:    Patient ID: Lauren Macias, female    DOB: 01-Jul-1986, 38 y.o.   MRN: [id number]  HPI: Lauren Macias is a 38 y.o. female  Chief Complaint  Patient presents with   Medical Management of Chronic Issues   Medication Refill    Discussed the use of AI scribe software for clinical note transcription with the patient, who gave verbal consent to proceed.  History of Present Illness Lauren Macias is a 38 year old female who presents for a routine follow-up.  She has a history of anxiety, ADHD, depression, and obesity. She is currently taking Vyvanse  60 mg daily and Wellbutrin  300 mg daily. The Vyvanse  seems to lose its effect by 2 PM, which she describes as hitting a 'wall'.  She previously experienced bloody stools and attempted to schedule an appointment for this issue with GI but was unable to secure one. The issue has since resolved with no further problems.  She is currently going through a separation, which has compounded her anxiety and depression. Despite this, she feels that the Wellbutrin  is still effective.  She is planning to take a travel position for the summer, which will involve staying with family in [facility], about 45 minutes outside of [place]. She plans to take her children with her and is looking forward to the change of scenery.         01/11/2024   10:11 AM 10/13/2023   10:13 AM 07/14/2023   11:05 AM  Depression screen PHQ 2/9  Decreased Interest 3 2 2   Down, Depressed, Hopeless 3 2 2   PHQ - 2 Score 6 4 4   Altered sleeping 3 2 1   Tired, decreased energy 3 2 1   Change in appetite 3 2 1   Feeling bad or failure about yourself  3 3 3   Trouble concentrating 3 3 3   Moving slowly or fidgety/restless 3 2 0  Suicidal thoughts 0 0 0  PHQ-9 Score 24 18 13   Difficult doing  work/chores Somewhat difficult Somewhat difficult Somewhat difficult       01/11/2024   10:09 AM 07/14/2023   11:06 AM 03/06/2023    8:15 AM 11/10/2022    9:08 AM  GAD 7 : Generalized Anxiety Score  Nervous, Anxious, on Edge 3 1 1 2   Control/stop worrying 3 0 1 1  Worry too much - different things 3 1 1 2   Trouble relaxing 3 0 0 1  Restless 3 2 0 1  Easily annoyed or irritable 0 0 1 1  Afraid - awful might happen 3 0 1 1  Total GAD 7 Score 18 4 5 9   Anxiety Difficulty Somewhat difficult Not difficult at all Somewhat difficult Somewhat difficult     Relevant past medical, surgical, family and social history reviewed and updated as indicated. Interim medical history since our last visit reviewed. Allergies and medications reviewed and updated.  Review of Systems  Constitutional: Negative for fever or weight change.  Respiratory: Negative for cough and shortness of breath.   Cardiovascular: Negative for chest pain or palpitations.  Gastrointestinal: Negative for abdominal pain, no bowel changes.  Musculoskeletal: Negative for gait problem or joint swelling.  Skin: Negative for rash.  Neurological:  Negative for dizziness or headache.  No other specific complaints in a complete review of systems (except as listed in HPI above).      Objective:     BP 136/88   Pulse (!) 102   Temp 98.1 F (36.7 C)   Resp 18   Ht 5\' 11"  (1.803 m)   Wt 290 lb 6.4 oz (131.7 kg)   SpO2 97%   BMI 40.50 kg/m    Wt Readings from Last 3 Encounters:  01/11/24 290 lb 6.4 oz (131.7 kg)  10/13/23 280 lb (127 kg)  07/14/23 274 lb 9.6 oz (124.6 kg)    Physical Exam Vitals reviewed.  Constitutional:      Appearance: Normal appearance.  HENT:     Head: Normocephalic.  Cardiovascular:     Rate and Rhythm: Normal rate and regular rhythm.  Pulmonary:     Effort: Pulmonary effort is normal.     Breath sounds: Normal breath sounds.  Musculoskeletal:        General: Normal range of motion.  Skin:     General: Skin is warm and dry.  Neurological:     General: No focal deficit present.     Mental Status: She is alert and oriented to person, place, and time. Mental status is at baseline.  Psychiatric:        Mood and Affect: Mood normal.        Behavior: Behavior normal.        Thought Content: Thought content normal.        Judgment: Judgment normal.    Physical Exam         Assessment & Plan:   Problem List Items Addressed This Visit       Other   Anxiety - Primary   Relevant Medications   buPROPion  (WELLBUTRIN  XL) 300 MG 24 hr tablet   Obesity   Relevant Medications   lisdexamfetamine (VYVANSE ) 60 MG capsule (Start on 03/11/2024)   lisdexamfetamine (VYVANSE ) 60 MG capsule (Start on 02/10/2024)   lisdexamfetamine (VYVANSE ) 60 MG capsule   lisdexamfetamine (VYVANSE ) 20 MG capsule (Start on 03/11/2024)   lisdexamfetamine (VYVANSE ) 20 MG capsule (Start on 02/10/2024)   lisdexamfetamine (VYVANSE ) 20 MG capsule   Depression   Relevant Medications   buPROPion  (WELLBUTRIN  XL) 300 MG 24 hr tablet   Attention-deficit hyperactivity disorder, unspecified type   Relevant Medications   lisdexamfetamine (VYVANSE ) 60 MG capsule (Start on 03/11/2024)   lisdexamfetamine (VYVANSE ) 60 MG capsule (Start on 02/10/2024)   lisdexamfetamine (VYVANSE ) 60 MG capsule   lisdexamfetamine (VYVANSE ) 20 MG capsule (Start on 03/11/2024)   lisdexamfetamine (VYVANSE ) 20 MG capsule (Start on 02/10/2024)   lisdexamfetamine (VYVANSE ) 20 MG capsule   Other Visit Diagnoses       Screening for deficiency anemia       Relevant Orders   CBC with Differential/Platelet     Screening for cholesterol level       Relevant Orders   Lipid panel     Screening for diabetes mellitus       Relevant Orders   Comprehensive metabolic panel with GFR   Hemoglobin A1c        Assessment and Plan Assessment & Plan Depression Chronic depression managed with Wellbutrin  300 mg daily. She reports continued effectiveness of  Wellbutrin . Undergoing a separation, exacerbating anxiety and depression symptoms. - Continue Wellbutrin  300 mg daily  ADHD ADHD managed with Vyvanse  60 mg daily. Reports decreased effectiveness in the afternoon, suggesting possible tolerance development. -  Continue Vyvanse  60 mg in the morning - Add Vyvanse  20 mg at lunchtime to address afternoon symptom resurgence    Obesity -continue working on lifestyle modification      Follow up plan: Return in about 3 months (around 04/12/2024) for follow up.

## 2024-01-16 ENCOUNTER — Encounter: Admitting: Nurse Practitioner

## 2024-02-05 ENCOUNTER — Encounter: Payer: Self-pay | Admitting: Nurse Practitioner

## 2024-02-08 ENCOUNTER — Other Ambulatory Visit: Payer: Self-pay | Admitting: Nurse Practitioner

## 2024-02-08 DIAGNOSIS — F902 Attention-deficit hyperactivity disorder, combined type: Secondary | ICD-10-CM

## 2024-02-08 MED ORDER — LISDEXAMFETAMINE DIMESYLATE 20 MG PO CAPS: ORAL_CAPSULE | ORAL | 0 refills | Status: AC

## 2024-02-08 MED ORDER — LISDEXAMFETAMINE DIMESYLATE 60 MG PO CAPS: 60.0000 mg | ORAL_CAPSULE | ORAL | 0 refills | Status: AC

## 2024-02-09 ENCOUNTER — Encounter (HOSPITAL_COMMUNITY): Payer: Self-pay | Admitting: *Deleted

## 2024-03-11 ENCOUNTER — Other Ambulatory Visit: Payer: Self-pay | Admitting: Nurse Practitioner

## 2024-03-11 DIAGNOSIS — F902 Attention-deficit hyperactivity disorder, combined type: Secondary | ICD-10-CM

## 2024-03-13 NOTE — Telephone Encounter (Signed)
 Requested medication (s) are due for refill today: yes  Requested medication (s) are on the active medication list: yes  Last refill:  02/10/24 #30/0  Future visit scheduled: yes  Notes to clinic:  Unable to refill per protocol, cannot delegate.     Requested Prescriptions  Pending Prescriptions Disp Refills   lisdexamfetamine (VYVANSE ) 20 MG capsule [Pharmacy Med Name: Lisdexamfetamine Dimesylate  20MG  CAPS] 30 capsule     Sig: TAKE 1 CAPSULE BY MOUTH IN THE AFTERNOON IF NEEDED.     Not Delegated - Psychiatry:  Stimulants/ADHD Failed - 03/13/2024  2:51 PM      Failed - This refill cannot be delegated      Failed - Urine Drug Screen completed in last 360 days      Passed - Last BP in normal range    BP Readings from Last 1 Encounters:  01/11/24 136/88         Passed - Last Heart Rate in normal range    Pulse Readings from Last 1 Encounters:  01/11/24 (!) 102         Passed - Valid encounter within last 6 months    Recent Outpatient Visits           2 months ago Anxiety   [facility] Gareth Clarity F, FNP   5 months ago Class 3 severe obesity with body mass index (BMI) of 45.0 to 49.9 in adult, unspecified obesity type, unspecified whether serious comorbidity present [facility])   [facility] Gareth Clarity [place], FNP               lisdexamfetamine (VYVANSE ) 60 MG capsule [Pharmacy Med Name: Lisdexamfetamine Dimesylate  60MG  CAPS] 30 capsule     Sig: TAKE 1 CAPSULE BY MOUTH EVERY MORNING     Not Delegated - Psychiatry:  Stimulants/ADHD Failed - 03/13/2024  2:51 PM      Failed - This refill cannot be delegated      Failed - Urine Drug Screen completed in last 360 days      Passed - Last BP in normal range    BP Readings from Last 1 Encounters:  01/11/24 136/88         Passed - Last Heart Rate in normal range    Pulse Readings from Last 1 Encounters:  01/11/24 (!) 102         Passed - Valid encounter within last 6  months    Recent Outpatient Visits           2 months ago Anxiety   [facility] Gareth Clarity F, FNP   5 months ago Class 3 severe obesity with body mass index (BMI) of 45.0 to 49.9 in adult, unspecified obesity type, unspecified whether serious comorbidity present [facility])   [facility] Gareth Clarity [place], [place]

## 2024-03-18 ENCOUNTER — Encounter: Payer: Self-pay | Admitting: Nurse Practitioner

## 2024-03-18 DIAGNOSIS — F902 Attention-deficit hyperactivity disorder, combined type: Secondary | ICD-10-CM

## 2024-03-18 MED ORDER — LISDEXAMFETAMINE DIMESYLATE 20 MG PO CAPS: ORAL_CAPSULE | ORAL | 0 refills | Status: AC

## 2024-03-18 MED ORDER — LISDEXAMFETAMINE DIMESYLATE 60 MG PO CAPS: 60.0000 mg | ORAL_CAPSULE | ORAL | 0 refills | Status: AC

## 2024-04-12 ENCOUNTER — Other Ambulatory Visit: Payer: Self-pay | Admitting: Nurse Practitioner

## 2024-04-12 DIAGNOSIS — F902 Attention-deficit hyperactivity disorder, combined type: Secondary | ICD-10-CM

## 2024-04-15 ENCOUNTER — Ambulatory Visit: Admitting: Nurse Practitioner

## 2024-04-15 NOTE — Progress Notes (Deleted)
   There were no vitals taken for this visit.   Subjective:    Patient ID: Lauren Macias, female    DOB: 04/18/1986, 38 y.o.   MRN: [id number]  HPI: Lauren Macias is a 38 y.o. female  No chief complaint on file.   Discussed the use of AI scribe software for clinical note transcription with the patient, who gave verbal consent to proceed.  History of Present Illness     There is no height or weight on file to calculate BMI.  There were no vitals filed for this visit.   Waist Measurement : (not recorded)      01/11/2024   10:11 AM 10/13/2023   10:13 AM 07/14/2023   11:05 AM  Depression screen PHQ 2/9  Decreased Interest 3 2 2   Down, Depressed, Hopeless 3 2 2   PHQ - 2 Score 6 4 4   Altered sleeping 3 2 1   Tired, decreased energy 3 2 1   Change in appetite 3 2 1   Feeling bad or failure about yourself  3 3 3   Trouble concentrating 3 3 3   Moving slowly or fidgety/restless 3 2 0  Suicidal thoughts 0 0 0  PHQ-9 Score 24 18 13   Difficult doing work/chores Somewhat difficult Somewhat difficult Somewhat difficult    Relevant past medical, surgical, family and social history reviewed and updated as indicated. Interim medical history since our last visit reviewed. Allergies and medications reviewed and updated.  Review of Systems  Constitutional: Negative for fever or weight change.  Respiratory: Negative for cough and shortness of breath.   Cardiovascular: Negative for chest pain or palpitations.  Gastrointestinal: Negative for abdominal pain, no bowel changes.  Musculoskeletal: Negative for gait problem or joint swelling.  Skin: Negative for rash.  Neurological: Negative for dizziness or headache.  No other specific complaints in a complete review of systems (except as listed in HPI above).      Objective:     There were no vitals taken for this visit.  {Vitals History (Optional):23777} Wt Readings from Last 3 Encounters:  01/11/24 290 lb 6.4 oz (131.7 kg)   10/13/23 280 lb (127 kg)  07/14/23 274 lb 9.6 oz (124.6 kg)    Physical Exam Physical Exam    Results for orders placed or performed in visit on 07/18/23  POC Hemoccult Bld/Stl (3-Cd Home Screen)   Collection Time: 07/18/23  1:30 PM  Result Value Ref Range   Card #1 Date 07/15/2023    Fecal Occult Blood, POC Positive (A) Negative   Card #2 Date 07/16/2023    Card #2 Fecal Occult Blod, POC Positive    Card #3 Date 07/17/2023    Card #3 Fecal Occult Blood, POC Positive    {Labs (Optional):23779}       Assessment & Plan:   Problem List Items Addressed This Visit       Other   Anxiety   Obesity   Depression   Attention-deficit hyperactivity disorder, unspecified type - Primary     Assessment and Plan Assessment & Plan         Follow up plan: No follow-ups on file.

## 2024-04-16 NOTE — Telephone Encounter (Signed)
 Unable to refuse

## 2024-04-16 NOTE — Telephone Encounter (Signed)
 Requested medication (s) are due for refill today:   Provider to review  Requested medication (s) are on the active medication list:   Yes for both  Future visit scheduled:   No   Last ordered: Both 03/18/2024 #30, 0 refills  Non delegated refill    Requested Prescriptions  Pending Prescriptions Disp Refills   lisdexamfetamine (VYVANSE ) 60 MG capsule [Pharmacy Med Name: Lisdexamfetamine Dimesylate  60MG  CAPS] 30 capsule     Sig: TAKE 1 CAPSULE BY MOUTH EVERY MORNING     Not Delegated - Psychiatry:  Stimulants/ADHD Failed - 04/16/2024  2:47 PM      Failed - This refill cannot be delegated      Failed - Urine Drug Screen completed in last 360 days      Passed - Last BP in normal range    BP Readings from Last 1 Encounters:  01/11/24 136/88         Passed - Last Heart Rate in normal range    Pulse Readings from Last 1 Encounters:  01/11/24 (!) 102         Passed - Valid encounter within last 6 months    Recent Outpatient Visits           3 months ago Anxiety   [facility] Gareth Clarity F, FNP   6 months ago Class 3 severe obesity with body mass index (BMI) of 45.0 to 49.9 in adult, unspecified obesity type, unspecified whether serious comorbidity present [facility])   [facility] Gareth Clarity F, FNP               lisdexamfetamine (VYVANSE ) 20 MG capsule [Pharmacy Med Name: Lisdexamfetamine Dimesylate  20MG  CAPS] 30 capsule     Sig: TAKE 1 CAPSULE BY MOUTH IN THE AFTERNOON IF NEEDED.     Not Delegated - Psychiatry:  Stimulants/ADHD Failed - 04/16/2024  2:47 PM      Failed - This refill cannot be delegated      Failed - Urine Drug Screen completed in last 360 days      Passed - Last BP in normal range    BP Readings from Last 1 Encounters:  01/11/24 136/88         Passed - Last Heart Rate in normal range    Pulse Readings from Last 1 Encounters:  01/11/24 (!) 102         Passed - Valid encounter within last 6  months    Recent Outpatient Visits           3 months ago Anxiety   [facility] Gareth Clarity F, FNP   6 months ago Class 3 severe obesity with body mass index (BMI) of 45.0 to 49.9 in adult, unspecified obesity type, unspecified whether serious comorbidity present [facility])   [facility] Gareth Clarity [place], [place]

## 2024-04-22 ENCOUNTER — Other Ambulatory Visit: Payer: Self-pay | Admitting: Nurse Practitioner

## 2024-04-22 DIAGNOSIS — F902 Attention-deficit hyperactivity disorder, combined type: Secondary | ICD-10-CM

## 2024-04-24 NOTE — Telephone Encounter (Signed)
 Requested medication (s) are due for refill today: yes  Requested medication (s) are on the active medication list: yes  Last refill:  03/18/24 #30  Future visit scheduled: no  Notes to clinic:  med not delegated to NT to RF   Requested Prescriptions  Pending Prescriptions Disp Refills   lisdexamfetamine (VYVANSE ) 60 MG capsule [Pharmacy Med Name: Lisdexamfetamine Dimesylate  60MG  CAPS] 30 capsule     Sig: TAKE 1 CAPSULE BY MOUTH EVERY MORNING     Not Delegated - Psychiatry:  Stimulants/ADHD Failed - 04/24/2024  9:12 AM      Failed - This refill cannot be delegated      Failed - Urine Drug Screen completed in last 360 days      Passed - Last BP in normal range    BP Readings from Last 1 Encounters:  01/11/24 136/88         Passed - Last Heart Rate in normal range    Pulse Readings from Last 1 Encounters:  01/11/24 (!) 102         Passed - Valid encounter within last 6 months    Recent Outpatient Visits           3 months ago Anxiety   [facility] Gareth Clarity F, FNP   6 months ago Class 3 severe obesity with body mass index (BMI) of 45.0 to 49.9 in adult, unspecified obesity type, unspecified whether serious comorbidity present [facility])   [facility] Gareth Clarity [place], [place]

## 2024-09-14 NOTE — Progress Notes (Signed)
 [facility] CONSULTATION VIS[facility]      REFERRING PROVIDER:  Gareth Mliss Manuel, FNP  [address]  [address]  [place],  [place] [address]    PRIMARY CARE PROVIDER:  [facility]      PATIENT PROFILE:        Lauren Macias is a 39 y.o. female (DOB: 26-Sep-1985) who is seen in consultation at the request of Dr. Gareth for evaluation of blood in the stool and elevated fecal calprotectin.          ASSESSMENT:         This is a 39 y.o. year old female with PMH sign't for adhd, anxiety, depression who presents for further evaluation of brbpr and elevated fecal cal protectin    Assessment & Plan  Alternating bowel habits : post sleeve had acute consitpation that resolved but now notes alternating bowel habits. Is currently taking colace and miralax  every other days which has helped. + straining. + incomplete evacuation   - Ordered abdominal x-ray to assess stool burden and guide bowel cleanout prior to colonoscopy.  - Ordered blood work to assess for anemia and screen for celiac disease. Tsh in 2024 wnl.   - Recommended continuation of daily docusate and Miralax  every other day as tolerated.  - Planned bowel cleanout with Miralax /Gatorade if x-ray shows significant stool burden, to be done over a weekend or at home.  - Provided guidance on effective bowel preparation prior to colonoscopy.    Evaluation of rectal bleeding  Intermittent painless rectal bleeding x months. Noted with diarrhea as well as harder stools. No unexpected weight loss. Cbc wnl. Fecal calprotectin elevated. Infectious studies negative. No FH of IBD but two second degree relatives with crc. Warrants more urgent colonoscopy with evaluation of the TI. Differential includes hemorrhoids, avms, larger polyps, hemorrhoids, etc.   - Ordered colonoscopy to evaluate rectal bleeding   - Provided instructions for colonoscopy scheduling and emphasized urgency due to family history and symptoms.  - Discussed possible future hemorrhoidal banding if hemorrhoids are identified.  - Prescribed ondansetron  for nausea associated with bowel preparation. Despite sleeve, patient feels she should be able to manage larger volume prep.     Iron deficiency anemia  Chronic iron deficiency anemia per patient thoguht secondary to poor absorption since sleeve and menorrhagia.     - Ordered blood work to reassess hemoglobin    - patients pcp is following iron dtudies  - patient is on oral iron. Continue for now    Gastroesophageal reflux disease  Intermittent, mild heartburn controlled with dietary modifications. Recent increase in upper GI symptoms including nausea and early satiety. This may be due to constipation but could be an atypical manifestation of gerd or sibo vs imo. Will start with a ppi trial. If symptoms persist, could eval further with EGD.   - Prescribed omeprazole  daily for 6-8 weeks as empiric therapy for reflux and upper GI symptoms.  - Instructed to take omeprazole  30 minutes before breakfast.  - Advised to continue omeprazole  if symptomatic improvement occurs; discontinue after 8 weeks if no benefit.  - Deferred upper endoscopy for now; will reconsider if upper GI symptoms persist or worsen despite therapy.          PLAN:           xray - to look for stool burden  - labs today  - please start 40 mg daily - 30 minutes before breakfast - we will consider  EGD if symptoms don't improve  - please call to schedule colonoscopy  - follow up in 1 month      CHIEF COMPLAINT: Alternating diarrhea and constipation with blood    HISTORY OF PRESENT ILLNESS: This is a 39 y.o. year old female with PMH sign't for adhd, anxiety, depression who presents for further evaluation of brbpr and elevated fecal cal protectin      History of Present Illness  Lauren Macias is a 39 year old female with prior gastric sleeve surgery, iron deficiency anemia, and endometriosis who presents for evaluation of rectal bleeding x months .    Over the past several months, rectal bleeding has occurred intermittently with both diarrhea and constipation. Episodes are painless and sudden in onset, with significant blood noted in the toilet and on wiping. The most recent episode was five days ago, involving two consecutive bowel movements with blood, followed by resolution the next day. No pain during defecation, but a sense of urgency is present beforehand. She reports that her prior doctor checked for hemorrhoids and told her that she did not have external or internal hemorrhoids.    Significant changes in bowel habits have developed over the past year, characterized by alternating constipation and diarrhea. Typically, she has one bowel movement daily, with consistency ranging from solid to liquid. Diarrhea occurs approximately once per week, and severe constipation sometimes requires additional laxatives such as Miralax  and magnesium citrate. During constipation, stools are hard and pebble-like, with straining and a sensation of incomplete evacuation, which has worsened over the past month despite daily Colace and Miralax  every other day. Abdominal pain and pressure, sometimes associated with eating, and intermittent cramping are also present.    Gastric sleeve surgery was performed in October 2022 after previously weighing over 400 pounds. Severe constipation persisted for 6-7 months postoperatively, improving as eating habits stabilized. She continues to consume protein shakes in the morning and at night and has plateaued in her weight loss. A barium swallow prior to surgery was normal; upper endoscopy was canceled.    Iron deficiency anemia worsens after menstrual periods. She takes liquid iron as prescribed. Heavy, clotting menstrual periods are present, and she has a history of endometriosis with two prior ablations before having children. Fatigue has increased over the past month, particularly after her last period. Nausea and early satiety have been more prominent over the past three months, with worsening symptoms in the last month, sometimes occurring after taking liquid iron or with eating.    Occasional heartburn was previously severe at night but is now controlled by avoiding eating within four hours of bedtime. No current difficulty swallowing or pain with swallowing. She is not currently pregnant and has had her tubes tied since 2010.    Current medications include Colace daily, Miralax  every other day, and liquid iron. Colace is stopped during episodes of diarrhea, particularly during her menstrual cycle, when she experiences two days of severe diarrhea.        REVIEW OF SYSTEMS:     The balance of 12 systems reviewed is negative except as noted in the HPI.     PAST MEDICAL HISTORY:    Past Medical History:   Diagnosis Date    ADHD     Adult-onset obesity     Anxiety     Depression        PAST SURGICAL HISTORY:    No past surgical history on file.    MEDICATIONS:      Current  Outpatient Medications:     buPROPion  (WELLBUTRIN  XL) 300 MG 24 hr tablet, Take 1 tablet (300 mg total) by mouth daily., Disp: 90 tablet, Rfl: 1    docusate sodium (COLACE) 100 MG capsule, Take 1 capsule (100 mg total) by mouth., Disp: , Rfl:     lisdexamfetamine  (VYVANSE ) 20 MG cap capsule, Take 1 capsule (20 mg total) by mouth in the afternoon if needed, Disp: 30 capsule, Rfl: 0    lisdexamfetamine  (VYVANSE ) 60 MG cap capsule, Take 1 capsule (60 mg total) by mouth every morning., Disp: 30 capsule, Rfl: 0    lisdexamfetamine  (VYVANSE ) 60 MG cap capsule, Take 1 capsule (60 mg total) by mouth every morning. DNF 03/11/24, Disp: 30 capsule, Rfl: 0    multivitamin (TAB-A-VITE/THERAGRAN) per tablet, Take 1 tablet by mouth., Disp: , Rfl:     phenazopyridine (PYRIDIUM) 100 MG tablet, Take 1 tablet (100 mg total) by mouth., Disp: , Rfl:     omeprazole  (PRILOSEC) 40 MG capsule, Take 1 capsule (40 mg total) by mouth daily., Disp: 30 capsule, Rfl: 6    ondansetron  (ZOFRAN -ODT) 4 MG disintegrating tablet, Dissolve 1 tablet (4 mg total) in the mouth every twelve (12) hours as needed for nausea for up to 2 doses., Disp: 2 tablet, Rfl: 0    permethrin  (ELIMITE ) 5 % cream, Apply to entire body other than face - let sit for 14 hours then wash off, may repeat in 1 week if still having symptoms, Disp: 60 g, Rfl: 1  Prescriptions Prior to Admission[1]    ALLERGIES:    Penicillins    SOCIAL HISTORY:    Social History[2]    FAMILY HISTORY:    family history is not on file. FH of second degree relatives with crc. Father died of early onset dementia. No FDR with CRC or IBD      VITAL SIGNS:    BP 126/83  - Pulse 76  - Temp 36.5 ??C (97.7 ??F) (Temporal)  - Ht 180.3 cm (5' 11)  - Wt (!) 127.2 kg (280 lb 6.4 oz)  - BMI 39.11 kg/m??     PHYSICAL EXAM:    Deferred to colonoscopy    DIAGNOSTIC STUDIES:  I have reviewed all pertinent diagnostic studies, including:    GI Procedures:    Colonoscopy ordered     Radiographic studies:    Barium swallow 6/22:  FINDINGS:   Esophagus and stomach are widely patent. No focal abnormality   identified. No evidence of reflux or ulceration. No hiatal hernia   noted. Standard barium tablet passes normally.       Laboratory results:    Lab results are pending.     RESULT/COMMENT:   .   No Clostridium difficile isolated.       ef Range & Units 1 yr ago Comments   Calprotectin  mcg/g 234 High           Shiga and campylobacter not detected.    Component  Ref Range & Units 1 yr ago Comments   WBC  3.8 - 10.8 Thousand/uL 9.0    RBC  3.80 - 5.10 Million/uL 4.45    Hemoglobin  11.7 - 15.5 g/dL 86.5    HCT  64.9 - 54.9 % 41.0    MCV  80.0 - 100.0 fL 92.1    MCH  27.0 - 33.0 pg 30.1    MCHC  32.0 - 36.0 g/dL 67.2             [  1] (Not in a hospital admission)  [2]   Social History  Socioeconomic History    Marital status: Married   Tobacco Use    Smoking status: Never     Passive exposure: Never    Smokeless tobacco: Never   Vaping Use    Vaping status: Never Used     Social Drivers of Health     Food Insecurity: Food Insecurity Present (01/11/2024)    Received from [facility]    Hunger Vital Sign     Within the past 12 months, you worried that your food would run out before you got the money to buy more.: Sometimes true     Within the past 12 months, the food you bought just didn't last and you didn't have money to get more.: Never true   Tobacco Use: Low Risk (07/22/2024)    Patient History     Smoking Tobacco Use: Never     Smokeless Tobacco Use: Never     Passive Exposure: Never   Transportation Needs: No Transportation Needs (01/11/2024)    Received from [facility] - Transportation     Lack of Transportation (Medical): No     Lack of Transportation (Non-Medical): No   Alcohol Use: Not At Risk (01/07/2022)    Received from [facility]    AUDIT-C     Q1: How often do you have a drink containing alcohol?: Monthly or less     Q2: How many drinks containing alcohol do you have on a typical day when you are drinking?: 1 or 2     Q3: How often do you have six or more drinks on one occasion?: Never   Physical Activity: Sufficiently Active (01/11/2024)    Received from [facility]    Exercise Vital Sign     On average, how many days per week do you engage in moderate to strenuous exercise (like a brisk walk)?: 3 days     On average, how many minutes do you engage in exercise at this level?: 60 min   Stress: Stress Concern Present (01/11/2024)    Received from [facility] - Occupational Stress Questionnaire     Feeling of Stress : Very much   Social Connections: Unknown (01/11/2024)    Received from [facility]    Social Connection and Isolation Panel     In a typical week, how many times do you talk on the phone with family, friends, or neighbors?: More than three times a week     How often do you get together with friends or relatives?: Three times a week     How often do you attend church or religious services?: Patient declined     Do you belong to any clubs or organizations such as church groups, unions, fraternal or athletic groups, or school groups?: No     Are you married, widowed, divorced, separated, never married, or living with a partner?: Separated   Financial Resource Strain: Medium Risk (01/11/2024)    Received from [facility]    Overall Financial Resource Strain (CARDIA)     Difficulty of Paying Living Expenses: Somewhat hard
